# Patient Record
Sex: Male | Born: 2000 | Race: Black or African American | Hispanic: No | Marital: Single | Smoking: Never smoker
Health system: Southern US, Community
[De-identification: ages and names within clinical notes are randomized; demographics above are authoritative.]

## PROBLEM LIST (undated history)

## (undated) DIAGNOSIS — R519 Headache, unspecified: Secondary | ICD-10-CM

## (undated) DIAGNOSIS — J45909 Unspecified asthma, uncomplicated: Secondary | ICD-10-CM

## (undated) DIAGNOSIS — K5792 Diverticulitis of intestine, part unspecified, without perforation or abscess without bleeding: Secondary | ICD-10-CM

## (undated) HISTORY — PX: WISDOM TOOTH EXTRACTION: SHX21

## (undated) HISTORY — DX: Diverticulitis of intestine, part unspecified, without perforation or abscess without bleeding: K57.92

## (undated) HISTORY — DX: Unspecified asthma, uncomplicated: J45.909

## (undated) HISTORY — DX: Headache, unspecified: R51.9

---
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
## 2019-01-01 DIAGNOSIS — Z0000 Encounter for general adult medical examination without abnormal findings: Secondary | ICD-10-CM | POA: Diagnosis not present

## 2019-01-01 DIAGNOSIS — Z111 Encounter for screening for respiratory tuberculosis: Secondary | ICD-10-CM | POA: Diagnosis not present

## 2019-01-01 DIAGNOSIS — Z23 Encounter for immunization: Secondary | ICD-10-CM | POA: Diagnosis not present

## 2019-04-29 DIAGNOSIS — R1030 Lower abdominal pain, unspecified: Secondary | ICD-10-CM | POA: Diagnosis not present

## 2019-09-26 DIAGNOSIS — Z20828 Contact with and (suspected) exposure to other viral communicable diseases: Secondary | ICD-10-CM | POA: Diagnosis not present

## 2020-01-08 ENCOUNTER — Ambulatory Visit: Payer: Self-pay | Attending: Family

## 2020-01-08 DIAGNOSIS — Z23 Encounter for immunization: Secondary | ICD-10-CM

## 2020-01-08 NOTE — Progress Notes (Signed)
   Covid-19 Vaccination Clinic  Name:  Gavin Taylor    MRN: [id number] DOB: [birth year]  01/08/2020  [name] was observed post Covid-19 immunization for 15 minutes without incident. He was provided with Vaccine Information Sheet and instruction to access the V-Safe system.   [name] was instructed to call 911 with any severe reactions post vaccine: Marland Kitchen Difficulty breathing  . Swelling of face and throat  . A fast heartbeat  . A bad rash all over body  . Dizziness and weakness   Immunizations Administered    Name Date Dose VIS Date Route   Moderna COVID-19 Vaccine 01/08/2020 11:47 AM 0.5 mL 09/23/2019 Intramuscular   Manufacturer: Moderna   Lot: [id number]   NDC: 80881-103-15

## 2020-01-20 DIAGNOSIS — N451 Epididymitis: Secondary | ICD-10-CM | POA: Diagnosis not present

## 2020-02-01 DIAGNOSIS — Z03818 Encounter for observation for suspected exposure to other biological agents ruled out: Secondary | ICD-10-CM | POA: Diagnosis not present

## 2020-02-01 DIAGNOSIS — Z20828 Contact with and (suspected) exposure to other viral communicable diseases: Secondary | ICD-10-CM | POA: Diagnosis not present

## 2020-02-10 ENCOUNTER — Ambulatory Visit: Payer: Self-pay | Attending: Family

## 2020-02-10 DIAGNOSIS — Z23 Encounter for immunization: Secondary | ICD-10-CM

## 2020-02-10 NOTE — Progress Notes (Signed)
   Covid-19 Vaccination Clinic  Name:  Gavin Taylor    MRN: [id number] DOB: 02/08/2001  02/10/2020  [name] was observed post Covid-19 immunization for 15 minutes without incident. He was provided with Vaccine Information Sheet and instruction to access the V-Safe system.   [name] was instructed to call 911 with any severe reactions post vaccine: Marland Kitchen Difficulty breathing  . Swelling of face and throat  . A fast heartbeat  . A bad rash all over body  . Dizziness and weakness   Immunizations Administered    Name Date Dose VIS Date Route   Moderna COVID-19 Vaccine 02/10/2020 11:04 AM 0.5 mL 09/2019 Intramuscular   Manufacturer: Moderna   Lot: [id number]   NDC: 67893-810-17

## 2020-04-11 DIAGNOSIS — Z20822 Contact with and (suspected) exposure to covid-19: Secondary | ICD-10-CM | POA: Diagnosis not present

## 2020-05-07 DIAGNOSIS — Z20822 Contact with and (suspected) exposure to covid-19: Secondary | ICD-10-CM | POA: Diagnosis not present

## 2020-05-07 DIAGNOSIS — Z03818 Encounter for observation for suspected exposure to other biological agents ruled out: Secondary | ICD-10-CM | POA: Diagnosis not present

## 2020-05-21 DIAGNOSIS — Z20822 Contact with and (suspected) exposure to covid-19: Secondary | ICD-10-CM | POA: Diagnosis not present

## 2020-09-28 DIAGNOSIS — Z03818 Encounter for observation for suspected exposure to other biological agents ruled out: Secondary | ICD-10-CM | POA: Diagnosis not present

## 2020-09-28 DIAGNOSIS — Z20822 Contact with and (suspected) exposure to covid-19: Secondary | ICD-10-CM | POA: Diagnosis not present

## 2020-10-08 ENCOUNTER — Ambulatory Visit (INDEPENDENT_AMBULATORY_CARE_PROVIDER_SITE_OTHER): Payer: BC Managed Care – PPO | Admitting: Family Medicine

## 2020-10-08 ENCOUNTER — Encounter: Payer: Self-pay | Admitting: Family Medicine

## 2020-10-08 ENCOUNTER — Other Ambulatory Visit: Payer: Self-pay

## 2020-10-08 VITALS — BP 120/78 | HR 78 | Temp 97.9°F | Ht 69.0 in | Wt 144.0 lb

## 2020-10-08 DIAGNOSIS — R519 Headache, unspecified: Secondary | ICD-10-CM | POA: Diagnosis not present

## 2020-10-08 DIAGNOSIS — K5792 Diverticulitis of intestine, part unspecified, without perforation or abscess without bleeding: Secondary | ICD-10-CM | POA: Diagnosis not present

## 2020-10-08 DIAGNOSIS — Z0000 Encounter for general adult medical examination without abnormal findings: Secondary | ICD-10-CM

## 2020-10-08 DIAGNOSIS — Z09 Encounter for follow-up examination after completed treatment for conditions other than malignant neoplasm: Secondary | ICD-10-CM

## 2020-10-08 DIAGNOSIS — J45909 Unspecified asthma, uncomplicated: Secondary | ICD-10-CM | POA: Diagnosis not present

## 2020-10-08 MED ORDER — ALBUTEROL SULFATE HFA 108 (90 BASE) MCG/ACT IN AERS: 2.0000 | INHALATION_SPRAY | Freq: Four times a day (QID) | RESPIRATORY_TRACT | 11 refills | Status: AC | PRN

## 2020-10-08 MED ORDER — IBUPROFEN 800 MG PO TABS: 800.0000 mg | ORAL_TABLET | Freq: Three times a day (TID) | ORAL | 3 refills | Status: DC | PRN

## 2020-10-08 NOTE — Progress Notes (Signed)
[facility]   New Patient--Establish Care  Subjective:  Patient ID: Gavin Taylor, male    DOB: 02-05-2001  Age: 19 y.o. MRN: [id number]  CC:  Chief Complaint  Patient presents with  . Abdominal Pain  . Establish Care   HPI Gavin Taylor is a 19 year old male who presents to Establish Care today.    There are no problems to display for this patient.   Current Status: This will be Mr. Faucett initial office visit with me. He was previously seeing a Physician in [place], [place], Dr. Tyrell Antonio for his PCP needs. He is a current Consulting civil engineer at [employer]. Since his last office visit, he states that he has had increased headaches intermittently, X 1 week now. He takes ASA and Acetaminophen for minimal relief. He denies fevers, chills, fatigue, recent infections, weight loss, and night sweats. He has not had any headaches, visual changes, dizziness, and falls. No chest pain, heart palpitations, cough and shortness of breath reported. No reports of GI problems such as nausea, vomiting, diarrhea, and constipation. He has no reports of blood in stools, dysuria and hematuria. No depression or anxiety reported today. He is taking all medications as prescribed. He denies pain today.   Past Medical History:  Diagnosis Date  . Asthma   . Diverticulitis   . Frequent headaches     Past Surgical History:  Procedure Laterality Date  . WISDOM TOOTH EXTRACTION     2019    Family History  Problem Relation Age of Onset  . Hypertension Father   . Asthma Sister     Social History   Socioeconomic History  . Marital status: Single    Spouse name: Not on file  . Number of children: Not on file  . Years of education: Not on file  . Highest education level: Not on file  Occupational History  . Not on file  Tobacco Use  . Smoking status: Never Smoker  . Smokeless tobacco: Never Used  Substance and Sexual Activity  . Alcohol use: Not Currently   . Drug use: Not Currently  . Sexual activity: Not on file  Other Topics Concern  . Not on file  Social History Narrative  . Not on file   Social Determinants of Health   Financial Resource Strain: Not on file  Food Insecurity: Not on file  Transportation Needs: Not on file  Physical Activity: Not on file  Stress: Not on file  Social Connections: Not on file  Intimate Partner Violence: Not on file    Outpatient Medications Prior to Visit  Medication Sig Dispense Refill  . diclofenac (VOLTAREN) 75 MG EC tablet Take 75 mg by mouth 2 (two) times daily.     No facility-administered medications prior to visit.    Not on File  ROS Review of Systems  Constitutional: Negative.   HENT: Negative.   Eyes: Negative.   Respiratory: Negative.   Cardiovascular: Negative.   Gastrointestinal: Positive for abdominal pain (occasional ).  Endocrine: Negative.   Genitourinary: Negative.   Musculoskeletal: Negative.   Skin: Negative.   Allergic/Immunologic: Negative.   Neurological: Negative.   Hematological: Negative.   Psychiatric/Behavioral: Negative.       Objective:    Physical Exam Vitals and nursing note reviewed.  Constitutional:      Appearance: Normal appearance. He is well-developed.  HENT:     Head: Normocephalic and atraumatic.     Nose: Nose normal.  Mouth/Throat:     Mouth: Mucous membranes are moist.     Pharynx: Oropharynx is clear.  Cardiovascular:     Rate and Rhythm: Normal rate.  Pulmonary:     Effort: Pulmonary effort is normal.     Breath sounds: Normal breath sounds.  Abdominal:     General: Bowel sounds are normal.     Palpations: Abdomen is soft.  Musculoskeletal:        General: Normal range of motion.     Cervical back: Normal range of motion and neck supple.  Skin:    General: Skin is warm and dry.  Neurological:     General: No focal deficit present.     Mental Status: He is alert and oriented to person, place, and time.   Psychiatric:        Mood and Affect: Mood normal.        Behavior: Behavior normal.        Thought Content: Thought content normal.        Judgment: Judgment normal.     BP 120/78   Pulse 78   Temp 97.9 F (36.6 C) (Temporal)   Ht 5\' 9"  (1.753 m)   Wt 144 lb (65.3 kg)   SpO2 98%   BMI 21.27 kg/m  Wt Readings from Last 3 Encounters:  10/08/20 144 lb (65.3 kg)     Health Maintenance Due  Topic Date Due  . Hepatitis C Screening  Never done  . HIV Screening  Never done  . TETANUS/TDAP  Never done  . INFLUENZA VACCINE  Never done    There are no preventive care reminders to display for this patient.  No results found for: TSH Lab Results  Component Value Date   WBC 4.0 10/08/2020   HGB 14.3 10/08/2020   HCT 42.4 10/08/2020   MCV 91 10/08/2020   PLT 253 10/08/2020   Lab Results  Component Value Date   NA 141 10/08/2020   K 4.5 10/08/2020   CO2 28 10/08/2020   GLUCOSE 75 10/08/2020   BUN 15 10/08/2020   CREATININE 1.03 10/08/2020   BILITOT <0.2 10/08/2020   ALKPHOS 53 10/08/2020   AST 18 10/08/2020   ALT 13 10/08/2020   PROT 7.8 10/08/2020   ALBUMIN 4.3 10/08/2020   CALCIUM 9.8 10/08/2020   No results found for: CHOL No results found for: HDL No results found for: LDLCALC No results found for: TRIG No results found for: CHOLHDL No results found for: 10/10/2020    Assessment & Plan:   1. Nonintractable headache, unspecified chronicity pattern, unspecified headache type Stable today. He will drink at least 64 oz of fluids daily, he will take Motrin as needed.  - ibuprofen (ADVIL) 800 MG tablet; Take 1 tablet (800 mg total) by mouth every 8 (eight) hours as needed.  Dispense: 30 tablet; Refill: 3  2. Mild asthma without complication, unspecified whether persistent Stable. No signs or symptoms of respiratory distress noted or reported.  - albuterol (VENTOLIN HFA) 108 (90 Base) MCG/ACT inhaler; Inhale 2 puffs into the lungs every 6 (six) hours as needed for  wheezing or shortness of breath.  Dispense: 8 g; Refill: 11  3. Diverticulitis Stable today. He will report to clinic if symptoms increase.  Recommendations for treatment of Diverticulosis:   Eating a high-fiber diet. This may include eating more fruits, vegetables, and grains.  Taking a fiber supplement.  Taking a live bacteria supplement (probiotic).  Taking medicine to relax your colon.  Taking antibiotic medicines.  4. Healthcare maintenance - CBC with Differential - Comprehensive metabolic panel  5. Follow up He will follow up in 6 months.   Meds ordered this encounter  Medications  . ibuprofen (ADVIL) 800 MG tablet    Sig: Take 1 tablet (800 mg total) by mouth every 8 (eight) hours as needed.    Dispense:  30 tablet    Refill:  3  . albuterol (VENTOLIN HFA) 108 (90 Base) MCG/ACT inhaler    Sig: Inhale 2 puffs into the lungs every 6 (six) hours as needed for wheezing or shortness of breath.    Dispense:  8 g    Refill:  11    Orders Placed This Encounter  Procedures  . CBC with Differential  . Comprehensive metabolic panel    Referral Orders  No referral(s) requested today    Raliegh Ip, MSN, ANE, FNP-BC [facility] [address] [place], [place] [address] [phone] fax   Problem List Items Addressed This Visit   None   Visit Diagnoses    Nonintractable headache, unspecified chronicity pattern, unspecified headache type    -  Primary   Relevant Medications   diclofenac (VOLTAREN) 75 MG EC tablet   ibuprofen (ADVIL) 800 MG tablet   Mild asthma without complication, unspecified whether persistent       Relevant Medications   albuterol (VENTOLIN HFA) 108 (90 Base) MCG/ACT inhaler   Diverticulitis       Healthcare maintenance       Relevant Orders   CBC with Differential (Completed)   Comprehensive metabolic panel (Completed)   Follow up           Meds ordered this encounter  Medications  . ibuprofen (ADVIL) 800 MG tablet    Sig: Take 1 tablet (800 mg total) by mouth every 8 (eight) hours as needed.    Dispense:  30 tablet    Refill:  3  . albuterol (VENTOLIN HFA) 108 (90 Base) MCG/ACT inhaler    Sig: Inhale 2 puffs into the lungs every 6 (six) hours as needed for wheezing or shortness of breath.    Dispense:  8 g    Refill:  11    Follow-up: Return in about 6 months (around 04/08/2021).    Kallie Locks, FNP

## 2020-10-08 NOTE — Patient Instructions (Signed)
Diverticulitis  Diverticulitis is when small pockets in your large intestine (colon) get infected or swollen. This causes stomach pain and watery poop (diarrhea). These pouches are called diverticula. They form in people who have a condition called diverticulosis. Follow these instructions at home: Medicines  Take over-the-counter and prescription medicines only as told by your doctor. These include: ? Antibiotics. ? Pain medicines. ? Fiber pills. ? Probiotics. ? Stool softeners.  Do not drive or use heavy machinery while taking prescription pain medicine.  If you were prescribed an antibiotic, take it as told. Do not stop taking it even if you feel better. General instructions   Follow a diet as told by your doctor.  When you feel better, your doctor may tell you to change your diet. You may need to eat a lot of fiber. Fiber makes it easier to poop (have bowel movements). Healthy foods with fiber include: ? Berries. ? Beans. ? Lentils. ? Green vegetables.  Exercise 3 or more times a week. Aim for 30 minutes each time. Exercise enough to sweat and make your heart beat faster.  Keep all follow-up visits as told. This is important. You may need to have an exam of the large intestine. This is called a colonoscopy. Contact a doctor if:  Your pain does not get better.  You have a hard time eating or drinking.  You are not pooping like normal. Get help right away if:  Your pain gets worse.  Your problems do not get better.  Your problems get worse very fast.  You have a fever.  You throw up (vomit) more than one time.  You have poop that is: ? Bloody. ? Black. ? Tarry. Summary  Diverticulitis is when small pockets in your large intestine (colon) get infected or swollen.  Take medicines only as told by your doctor.  Follow a diet as told by your doctor. This information is not intended to replace advice given to you by your health care provider. Make sure you  discuss any questions you have with your health care provider. Document Revised: 09/21/2017 Document Reviewed: 10/26/2016 Elsevier Patient Education  2020 Elsevier Inc.   General Headache Without Cause A headache is pain or discomfort that is felt around the head or neck area. There are many causes and types of headaches. In some cases, the cause may not be found. Follow these instructions at home: Watch your condition for any changes. Let your doctor know about them. Take these steps to help with your condition: Managing pain      Take over-the-counter and prescription medicines only as told by your doctor.  Lie down in a dark, quiet room when you have a headache.  If told, put ice on your head and neck area: ? Put ice in a plastic bag. ? Place a towel between your skin and the bag. ? Leave the ice on for 20 minutes, 2-3 times per day.  If told, put heat on the affected area. Use the heat source that your doctor recommends, such as a moist heat pack or a heating pad. ? Place a towel between your skin and the heat source. ? Leave the heat on for 20-30 minutes. ? Remove the heat if your skin turns bright red. This is very important if you are unable to feel pain, heat, or cold. You may have a greater risk of getting burned.  Keep lights dim if bright lights bother you or make your headaches worse. Eating and drinking  Eat  meals on a regular schedule.  If you drink alcohol: ? Limit how much you use to:  0-1 drink a day for women.  0-2 drinks a day for men. ? Be aware of how much alcohol is in your drink. In the [place] one drink equals one 12 oz bottle of beer (355 mL), one 5 oz glass of wine (148 mL), or one 1 oz glass of hard liquor (44 mL).  Stop drinking caffeine, or reduce how much caffeine you drink. General instructions   Keep a journal to find out if certain things bring on headaches. For example, write down: ? What you eat and drink. ? How much sleep you  get. ? Any change to your diet or medicines.  Get a massage or try other ways to relax.  Limit stress.  Sit up straight. Do not tighten (tense) your muscles.  Do not use any products that contain nicotine or tobacco. This includes cigarettes, e-cigarettes, and chewing tobacco. If you need help quitting, ask your doctor.  Exercise regularly as told by your doctor.  Get enough sleep. This often means 7-9 hours of sleep each night.  Keep all follow-up visits as told by your doctor. This is important. Contact a doctor if:  Your symptoms are not helped by medicine.  You have a headache that feels different than the other headaches.  You feel sick to your stomach (nauseous) or you throw up (vomit).  You have a fever. Get help right away if:  Your headache gets very bad quickly.  Your headache gets worse after a lot of physical activity.  You keep throwing up.  You have a stiff neck.  You have trouble seeing.  You have trouble speaking.  You have pain in the eye or ear.  Your muscles are weak or you lose muscle control.  You lose your balance or have trouble walking.  You feel like you will pass out (faint) or you pass out.  You are mixed up (confused).  You have a seizure. Summary  A headache is pain or discomfort that is felt around the head or neck area.  There are many causes and types of headaches. In some cases, the cause may not be found.  Keep a journal to help find out what causes your headaches. Watch your condition for any changes. Let your doctor know about them.  Contact a doctor if you have a headache that is different from usual, or if your headache is not helped by medicine.  Get help right away if your headache gets very bad, you throw up, you have trouble seeing, you lose your balance, or you have a seizure. This information is not intended to replace advice given to you by your health care provider. Make sure you discuss any questions you have  with your health care provider. Document Revised: 04/29/2018 Document Reviewed: 04/29/2018 Elsevier Patient Education  2020 Elsevier Inc. Ibuprofen tablets and capsules What is this medicine? IBUPROFEN (eye BYOO proe fen) is a non-steroidal anti-inflammatory drug (NSAID). It is used for dental pain, fever, headaches or migraines, osteoarthritis, rheumatoid arthritis, or painful monthly periods. It can also relieve minor aches and pains caused by a cold, flu, or sore throat. This medicine may be used for other purposes; ask your health care provider or pharmacist if you have questions. COMMON BRAND NAME(S): Advil, Advil Junior Strength, Advil Migraine, Genpril, Ibren, IBU, Ibupak, Midol, Midol Cramps and Body Aches, Motrin, Motrin IB, Motrin Junior Strength, Motrin Migraine Pain, Samson-8, Toxicology  Saliva Collection What should I tell my health care provider before I take this medicine? They need to know if you have any of these conditions:  cigarette smoker  coronary artery bypass graft (CABG) surgery within the past 2 weeks  drink more than 3 alcohol-containing drinks a day  heart disease  high blood pressure  history of stomach bleeding  kidney disease  liver disease  lung or breathing disease, like asthma  an unusual or allergic reaction to ibuprofen, aspirin, other NSAIDs, other medicines, foods, dyes, or preservatives  pregnant or trying to get pregnant  breast-feeding How should I use this medicine? Take this medicine by mouth with a glass of water. Follow the directions on the prescription label. Take this medicine with food if your stomach gets upset. Try to not lie down for at least 10 minutes after you take the medicine. Take your medicine at regular intervals. Do not take your medicine more often than directed. A special MedGuide will be given to you by the pharmacist with each prescription and refill. Be sure to read this information carefully each time. Talk to  your pediatrician regarding the use of this medicine in children. Special care may be needed. Overdosage: If you think you have taken too much of this medicine contact a poison control center or emergency room at once. NOTE: This medicine is only for you. Do not share this medicine with others. What if I miss a dose? If you miss a dose, take it as soon as you can. If it is almost time for your next dose, take only that dose. Do not take double or extra doses. What may interact with this medicine? Do not take this medicine with any of the following medications:  cidofovir  ketorolac  methotrexate  pemetrexed This medicine may also interact with the following medications:  alcohol  aspirin  diuretics  lithium  other drugs for inflammation like prednisone  warfarin This list may not describe all possible interactions. Give your health care provider a list of all the medicines, herbs, non-prescription drugs, or dietary supplements you use. Also tell them if you smoke, drink alcohol, or use illegal drugs. Some items may interact with your medicine. What should I watch for while using this medicine? Tell your doctor or healthcare provider if your symptoms do not start to get better or if they get worse. This medicine may cause serious skin reactions. They can happen weeks to months after starting the medicine. Contact your healthcare provider right away if you notice fevers or flu-like symptoms with a rash. The rash may be red or purple and then turn into blisters or peeling of the skin. Or, you might notice a red rash with swelling of the face, lips or lymph nodes in your neck or under your arms. This medicine does not prevent heart attack or stroke. In fact, this medicine may increase the chance of a heart attack or stroke. The chance may increase with longer use of this medicine and in people who have heart disease. If you take aspirin to prevent heart attack or stroke, talk with your  doctor or healthcare provider. Do not take other medicines that contain aspirin, ibuprofen, or naproxen with this medicine. Side effects such as stomach upset, nausea, or ulcers may be more likely to occur. Many medicines available without a prescription should not be taken with this medicine. This medicine can cause ulcers and bleeding in the stomach and intestines at any time during treatment. Ulcers and bleeding  can happen without warning symptoms and can cause death. To reduce your risk, do not smoke cigarettes or drink alcohol while you are taking this medicine. You may get drowsy or dizzy. Do not drive, use machinery, or do anything that needs mental alertness until you know how this medicine affects you. Do not stand or sit up quickly, especially if you are an older patient. This reduces the risk of dizzy or fainting spells. This medicine can cause you to bleed more easily. Try to avoid damage to your teeth and gums when you brush or floss your teeth. This medicine may be used to treat migraines. If you take migraine medicines for 10 or more days a month, your migraines may get worse. Keep a diary of headache days and medicine use. Contact your healthcare provider if your migraine attacks occur more frequently. What side effects may I notice from receiving this medicine? Side effects that you should report to your doctor or health care professional as soon as possible:  allergic reactions like skin rash, itching or hives, swelling of the face, lips, or tongue  redness, blistering, peeling or loosening of the skin, including inside the mouth  severe stomach pain  signs and symptoms of bleeding such as bloody or black, tarry stools; red or dark-brown urine; spitting up blood or brown material that looks like coffee grounds; red spots on the skin; unusual bruising or bleeding from the eye, gums, or nose  signs and symptoms of a blood clot such as changes in vision; chest pain; severe, sudden  headache; trouble speaking; sudden numbness or weakness of the face, arm, or leg  unexplained weight gain or swelling  unusually weak or tired  yellowing of eyes or skin Side effects that usually do not require medical attention (report to your doctor or health care professional if they continue or are bothersome):  bruising  diarrhea  dizziness, drowsiness  headache  nausea, vomiting This list may not describe all possible side effects. Call your doctor for medical advice about side effects. You may report side effects to FDA at 1-800-FDA-1088. Where should I keep my medicine? Keep out of the reach of children. Store at room temperature between 15 and 30 degrees C (59 and 86 degrees F). Keep container tightly closed. Throw away any unused medicine after the expiration date. NOTE: This sheet is a summary. It may not cover all possible information. If you have questions about this medicine, talk to your doctor, pharmacist, or health care provider.  2020 Elsevier/Gold Standard (2018-12-25 14:11:00)

## 2020-10-09 LAB — CBC WITH DIFFERENTIAL/PLATELET
Basophils Absolute: 0 10*3/uL (ref 0.0–0.2)
Basos: 1 %
EOS (ABSOLUTE): 0.1 10*3/uL (ref 0.0–0.4)
Eos: 2 %
Hematocrit: 42.4 % (ref 37.5–51.0)
Hemoglobin: 14.3 g/dL (ref 13.0–17.7)
Immature Grans (Abs): 0 10*3/uL (ref 0.0–0.1)
Immature Granulocytes: 0 %
Lymphocytes Absolute: 1.8 10*3/uL (ref 0.7–3.1)
Lymphs: 43 %
MCH: 30.6 pg (ref 26.6–33.0)
MCHC: 33.7 g/dL (ref 31.5–35.7)
MCV: 91 fL (ref 79–97)
Monocytes Absolute: 0.4 10*3/uL (ref 0.1–0.9)
Monocytes: 11 %
Neutrophils Absolute: 1.7 10*3/uL (ref 1.4–7.0)
Neutrophils: 43 %
Platelets: 253 10*3/uL (ref 150–450)
RBC: 4.68 x10E6/uL (ref 4.14–5.80)
RDW: 12.8 % (ref 11.6–15.4)
WBC: 4 10*3/uL (ref 3.4–10.8)

## 2020-10-09 LAB — COMPREHENSIVE METABOLIC PANEL
ALT: 13 IU/L (ref 0–44)
AST: 18 IU/L (ref 0–40)
Albumin/Globulin Ratio: 1.2 (ref 1.2–2.2)
Albumin: 4.3 g/dL (ref 4.1–5.2)
Alkaline Phosphatase: 53 IU/L (ref 51–125)
BUN/Creatinine Ratio: 15 (ref 9–20)
BUN: 15 mg/dL (ref 6–20)
Bilirubin Total: <0.2 mg/dL (ref 0.0–1.2)
CO2: 28 mmol/L (ref 20–29)
Calcium: 9.8 mg/dL (ref 8.7–10.2)
Chloride: 101 mmol/L (ref 96–106)
Creatinine, Ser: 1.03 mg/dL (ref 0.76–1.27)
GFR calc Af Amer: 120 mL/min/{1.73_m2} (ref >59)
GFR calc non Af Amer: 104 mL/min/{1.73_m2} (ref >59)
Globulin, Total: 3.5 g/dL (ref 1.5–4.5)
Glucose: 75 mg/dL (ref 65–99)
Potassium: 4.5 mmol/L (ref 3.5–5.2)
Sodium: 141 mmol/L (ref 134–144)
Total Protein: 7.8 g/dL (ref 6.0–8.5)

## 2020-10-10 ENCOUNTER — Encounter: Payer: Self-pay | Admitting: Family Medicine

## 2020-10-21 ENCOUNTER — Other Ambulatory Visit: Payer: BC Managed Care – PPO

## 2020-10-21 DIAGNOSIS — Z20822 Contact with and (suspected) exposure to covid-19: Secondary | ICD-10-CM | POA: Diagnosis not present

## 2020-10-23 LAB — SARS-COV-2, NAA 2 DAY TAT

## 2020-10-23 LAB — NOVEL CORONAVIRUS, NAA: SARS-CoV-2, NAA: NOT DETECTED

## 2020-10-26 ENCOUNTER — Telehealth (INDEPENDENT_AMBULATORY_CARE_PROVIDER_SITE_OTHER): Payer: BC Managed Care – PPO | Admitting: Family Medicine

## 2020-10-26 ENCOUNTER — Telehealth: Payer: BC Managed Care – PPO | Admitting: Family Medicine

## 2020-10-26 ENCOUNTER — Other Ambulatory Visit: Payer: Self-pay

## 2020-10-26 DIAGNOSIS — K5792 Diverticulitis of intestine, part unspecified, without perforation or abscess without bleeding: Secondary | ICD-10-CM

## 2020-10-26 DIAGNOSIS — Z09 Encounter for follow-up examination after completed treatment for conditions other than malignant neoplasm: Secondary | ICD-10-CM

## 2020-10-26 DIAGNOSIS — R519 Headache, unspecified: Secondary | ICD-10-CM

## 2020-10-26 DIAGNOSIS — J45909 Unspecified asthma, uncomplicated: Secondary | ICD-10-CM

## 2020-10-27 ENCOUNTER — Ambulatory Visit: Payer: BC Managed Care – PPO | Admitting: Family Medicine

## 2020-11-07 NOTE — Progress Notes (Signed)
Patient cancelled appointment for today.

## 2021-01-05 ENCOUNTER — Other Ambulatory Visit: Payer: Self-pay

## 2021-01-05 ENCOUNTER — Ambulatory Visit (INDEPENDENT_AMBULATORY_CARE_PROVIDER_SITE_OTHER): Payer: BC Managed Care – PPO | Admitting: Family Medicine

## 2021-01-05 ENCOUNTER — Encounter: Payer: Self-pay | Admitting: Family Medicine

## 2021-01-05 VITALS — BP 117/55 | HR 71 | Ht 69.0 in | Wt 142.0 lb

## 2021-01-05 DIAGNOSIS — Z09 Encounter for follow-up examination after completed treatment for conditions other than malignant neoplasm: Secondary | ICD-10-CM

## 2021-01-05 DIAGNOSIS — Z8719 Personal history of other diseases of the digestive system: Secondary | ICD-10-CM | POA: Diagnosis not present

## 2021-01-05 DIAGNOSIS — R519 Headache, unspecified: Secondary | ICD-10-CM

## 2021-01-05 DIAGNOSIS — K5792 Diverticulitis of intestine, part unspecified, without perforation or abscess without bleeding: Secondary | ICD-10-CM

## 2021-01-05 DIAGNOSIS — R1030 Lower abdominal pain, unspecified: Secondary | ICD-10-CM | POA: Diagnosis not present

## 2021-01-05 DIAGNOSIS — J45909 Unspecified asthma, uncomplicated: Secondary | ICD-10-CM

## 2021-01-05 MED ORDER — DOXYCYCLINE HYCLATE 100 MG PO TABS: 100.0000 mg | ORAL_TABLET | Freq: Two times a day (BID) | ORAL | 0 refills | Status: DC

## 2021-01-05 NOTE — Patient Instructions (Signed)
Doxycycline delayed-release capsules What is this medicine? DOXYCYCLINE (dox i SYE kleen) is a tetracycline antibiotic. It is used to treat certain kinds of bacterial infections, Lyme disease, and malaria. It will not work for colds, flu, or other viral infections. This medicine may be used for other purposes; ask your health care provider or pharmacist if you have questions. COMMON BRAND NAME(S): Oracea What should I tell my health care provider before I take this medicine? They need to know if you have any of these conditions: bowel disease like colitis liver disease long exposure to sunlight like working outdoors an unusual or allergic reaction to doxycycline, tetracycline antibiotics, other medicines, foods, dyes, or preservatives pregnant or trying to get pregnant breast-feeding How should I use this medicine? Take this medicine by mouth with a full glass of water. Follow the directions on the prescription label. Do not crush or chew. The capsules may be opened and the pellets sprinkled on applesauce. Swallow the pellets whole without chewing. Follow with an 8 ounce glass of water to help you swallow all the pellets. Do not prepare a dose and store for later use. The applesauce mixture should be taken immediately after you prepare it. It is best to take this medicine without other food, but if it upsets your stomach take it with food. Take your medicine at regular intervals. Do not take your medicine more often than directed. Take all of your medicine as directed even if you think your are better. Do not skip doses or stop your medicine early. Talk to your pediatrician regarding the use of this medicine in children. While this drug may be prescribed for selected conditions, precautions do apply. Overdosage: If you think you have taken too much of this medicine contact a poison control center or emergency room at once. NOTE: This medicine is only for you. Do not share this medicine with  others. What if I miss a dose? If you miss a dose, take it as soon as you can. If it is almost time for your next dose, take only that dose. Do not take double or extra doses. What may interact with this medicine? antacids barbiturates birth control pills bismuth subsalicylate carbamazepine methoxyflurane other antibiotics phenytoin vitamins that contain iron warfarin This list may not describe all possible interactions. Give your health care provider a list of all the medicines, herbs, non-prescription drugs, or dietary supplements you use. Also tell them if you smoke, drink alcohol, or use illegal drugs. Some items may interact with your medicine. What should I watch for while using this medicine? Tell your doctor or health care professional if your symptoms do not improve. Do not treat diarrhea with over the counter products. Contact your doctor if you have diarrhea that lasts more than 2 days or if it is severe and watery. Do not take this medicine just before going to bed. It may not dissolve properly when you lay down and can cause pain in your throat. Drink plenty of fluids while taking this medicine to also help reduce irritation in your throat. This medicine can make you more sensitive to the sun. Keep out of the sun. If you cannot avoid being in the sun, wear protective clothing and use sunscreen. Do not use sun lamps or tanning beds/booths. If you are being treated for a sexually transmitted infection, avoid sexual contact until you have finished your treatment. Your sexual partner may also need treatment. Avoid antacids, aluminum, calcium, magnesium, and iron products for 4 hours before and  2 hours after taking a dose of this medicine. Birth control pills may not work properly while you are taking this medicine. Talk to your doctor about using an extra method of birth control. If you are using this medicine to prevent malaria, you should still protect yourself from contact with  mosquitos. Stay in screened-in areas, use mosquito nets, keep your body covered, and use an insect repellent. What side effects may I notice from receiving this medicine? Side effects that you should report to your doctor or health care professional as soon as possible: allergic reactions like skin rash, itching or hives, swelling of the face, lips, or tongue difficulty breathing fever itching in the rectal or genital area pain on swallowing rash, fever, and swollen lymph nodes redness, blistering, peeling or loosening of the skin, including inside the mouth severe stomach pain or cramps unusual bleeding or bruising unusually weak or tired yellowing of the eyes or skin Side effects that usually do not require medical attention (report to your doctor or health care professional if they continue or are bothersome): diarrhea loss of appetite nausea, vomiting This list may not describe all possible side effects. Call your doctor for medical advice about side effects. You may report side effects to FDA at 1-800-FDA-1088. Where should I keep my medicine? Keep out of the reach of children. Store at room temperature, below 25 degrees C (77 degrees F). Protect from light. Keep container tightly closed. Throw away any unused medicine after the expiration date. Taking this medicine after the expiration date can make you seriously ill. NOTE: This sheet is a summary. It may not cover all possible information. If you have questions about this medicine, talk to your doctor, pharmacist, or health care provider.  2021 Elsevier/Gold Standard (2019-01-09 13:23:57) Diverticulitis  Diverticulitis is when small pouches in your colon (large intestine) get infected or swollen. This causes pain in the belly (abdomen) and watery poop (diarrhea). These pouches are called diverticula. The pouches form in people who have a condition called diverticulosis. What are the causes? This condition may be caused by poop  (stool) that gets trapped in the pouches in your colon. The poop lets germs (bacteria) grow in the pouches. This causes the infection. What increases the risk? You are more likely to get this condition if you have small pouches in your colon. The risk is higher if:  You are overweight or very overweight (obese).  You do not exercise enough.  You drink alcohol.  You smoke or use products with tobacco in them.  You eat a diet that has a lot of red meat such as beef, pork, or lamb.  You eat a diet that does not have enough fiber in it.  You are older than 21 years of age. What are the signs or symptoms?  Pain in the belly. Pain is often on the left side, but it may be in other areas.  Fever and feeling cold.  Feeling like you may vomit.  Vomiting.  Having cramps.  Feeling full.  Changes to how often you poop.  Blood in your poop. How is this treated? Most cases are treated at home by:  Taking over-the-counter pain medicines.  Following a clear liquid diet.  Taking antibiotic medicines.  Resting. Very bad cases may need to be treated at a hospital. This may include:  Not eating or drinking.  Taking prescription pain medicine.  Getting antibiotic medicines through an IV tube.  Getting fluid and food through an IV tube.  Having surgery. When you are feeling better, your doctor may tell you to have a test to check your colon (colonoscopy). Follow these instructions at home: Medicines  Take over-the-counter and prescription medicines only as told by your doctor. These include: ? Antibiotics. ? Pain medicines. ? Fiber pills. ? Probiotics. ? Stool softeners.  If you were prescribed an antibiotic medicine, take it as told by your doctor. Do not stop taking the antibiotic even if you start to feel better.  Ask your doctor if the medicine prescribed to you requires you to avoid driving or using machinery. Eating and drinking  Follow a diet as told by your  doctor.  When you feel better, your doctor may tell you to change your diet. You may need to eat a lot of fiber. Fiber makes it easier to poop (have a bowel movement). Foods with fiber include: ? Berries. ? Beans. ? Lentils. ? Green vegetables.  Avoid eating red meat.   General instructions  Do not use any products that contain nicotine or tobacco, such as cigarettes, e-cigarettes, and chewing tobacco. If you need help quitting, ask your doctor.  Exercise 3 or more times a week. Try to get 30 minutes each time. Exercise enough to sweat and make your heart beat faster.  Keep all follow-up visits as told by your doctor. This is important. Contact a doctor if:  Your pain does not get better.  You are not pooping like normal. Get help right away if:  Your pain gets worse.  Your symptoms do not get better.  Your symptoms get worse very fast.  You have a fever.  You vomit more than one time.  You have poop that is: ? Bloody. ? Black. ? Tarry. Summary  This condition happens when small pouches in your colon get infected or swollen.  Take medicines only as told by your doctor.  Follow a diet as told by your doctor.  Keep all follow-up visits as told by your doctor. This is important. This information is not intended to replace advice given to you by your health care provider. Make sure you discuss any questions you have with your health care provider. Document Revised: 07/21/2019 Document Reviewed: 07/21/2019 Elsevier Patient Education  2021 [employer].

## 2021-01-05 NOTE — Progress Notes (Signed)
[facility]   Established Patient Office Visit  Subjective:  Patient ID: Gavin Taylor, male    DOB: 02-07-2001  Age: 20 y.o. MRN: [id number]  CC:  Chief Complaint  Patient presents with  . Abdominal Pain    Ongoing for about a year.  History of taking diclofenac.    HPI Gavin Taylor is a 20 year old male who presents for Follow Up today.   There are no problems to display for this patient.  Current Status: Since his last office visit, he continues to have lower abdominal pain with history of diverticulitis. Denies GI problems such as nausea, vomiting, diarrhea, and constipation. He has no reports of blood in stools, dysuria and hematuria. He denies fevers, chills, fatigue, recent infections, weight loss, and night sweats. He has not had any headaches, visual changes, dizziness, and falls. No chest pain, heart palpitations, cough and shortness of breath reported.  No depression or anxiety reported today. He is taking all medications as prescribed.   Past Medical History:  Diagnosis Date  . Asthma   . Diverticulitis   . Frequent headaches     Past Surgical History:  Procedure Laterality Date  . WISDOM TOOTH EXTRACTION     2019    Family History  Problem Relation Age of Onset  . Hypertension Father   . Asthma Sister     Social History   Socioeconomic History  . Marital status: Single    Spouse name: Not on file  . Number of children: Not on file  . Years of education: Not on file  . Highest education level: Not on file  Occupational History  . Not on file  Tobacco Use  . Smoking status: Never Smoker  . Smokeless tobacco: Never Used  Substance and Sexual Activity  . Alcohol use: Not Currently  . Drug use: Not Currently  . Sexual activity: Not on file  Other Topics Concern  . Not on file  Social History Narrative  . Not on file   Social Determinants of Health   Financial Resource Strain: Not on file  Food  Insecurity: Not on file  Transportation Needs: Not on file  Physical Activity: Not on file  Stress: Not on file  Social Connections: Not on file  Intimate Partner Violence: Not on file    Outpatient Medications Prior to Visit  Medication Sig Dispense Refill  . albuterol (VENTOLIN HFA) 108 (90 Base) MCG/ACT inhaler Inhale 2 puffs into the lungs every 6 (six) hours as needed for wheezing or shortness of breath. 8 g 11  . diclofenac (VOLTAREN) 75 MG EC tablet Take 75 mg by mouth 2 (two) times daily.    Marland Kitchen ibuprofen (ADVIL) 800 MG tablet Take 1 tablet (800 mg total) by mouth every 8 (eight) hours as needed. 30 tablet 3   No facility-administered medications prior to visit.    Allergies  Allergen Reactions  . Omnicef [Cefdinir]     ROS Review of Systems  Constitutional: Negative.   HENT: Negative.   Eyes: Negative.   Respiratory: Negative.   Cardiovascular: Negative.   Gastrointestinal: Positive for abdominal pain (generalized).  Endocrine: Negative.   Genitourinary: Negative.   Musculoskeletal: Negative.   Skin: Negative.   Allergic/Immunologic: Negative.   Neurological: Positive for headaches (occasional ).  Hematological: Negative.   Psychiatric/Behavioral: Negative.    Objective:    Physical Exam Vitals and nursing note reviewed.  Constitutional:      Appearance: Normal appearance.  He is well-developed.  HENT:     Head: Normocephalic and atraumatic.     Mouth/Throat:     Mouth: Mucous membranes are moist.     Pharynx: Oropharynx is clear.  Cardiovascular:     Rate and Rhythm: Normal rate and regular rhythm.  Pulmonary:     Effort: Pulmonary effort is normal.     Breath sounds: Normal breath sounds.  Abdominal:     General: Bowel sounds are normal.     Palpations: Abdomen is soft.  Musculoskeletal:        General: Normal range of motion.     Cervical back: Normal range of motion and neck supple.  Skin:    General: Skin is warm and dry.  Neurological:      General: No focal deficit present.     Mental Status: He is alert and oriented to person, place, and time.  Psychiatric:        Mood and Affect: Mood normal.        Behavior: Behavior normal.        Thought Content: Thought content normal.        Judgment: Judgment normal.    BP (!) 117/55   Pulse 71   Ht 5\' 9"  (1.753 m)   Wt 142 lb (64.4 kg)   SpO2 100%   BMI 20.97 kg/m  Wt Readings from Last 3 Encounters:  01/05/21 142 lb (64.4 kg)  10/08/20 144 lb (65.3 kg)     Health Maintenance Due  Topic Date Due  . Hepatitis C Screening  Never done  . HPV VACCINES (1 - Male 2-dose series) Never done  . HIV Screening  Never done  . TETANUS/TDAP  Never done  . INFLUENZA VACCINE  Never done  . COVID-19 Vaccine (3 - Booster for Moderna series) 08/11/2020       Topic Date Due  . HPV VACCINES (1 - Male 2-dose series) Never done    No results found for: TSH Lab Results  Component Value Date   WBC 4.0 10/08/2020   HGB 14.3 10/08/2020   HCT 42.4 10/08/2020   MCV 91 10/08/2020   PLT 253 10/08/2020   Lab Results  Component Value Date   NA 141 10/08/2020   K 4.5 10/08/2020   CO2 28 10/08/2020   GLUCOSE 75 10/08/2020   BUN 15 10/08/2020   CREATININE 1.03 10/08/2020   BILITOT <0.2 10/08/2020   ALKPHOS 53 10/08/2020   AST 18 10/08/2020   ALT 13 10/08/2020   PROT 7.8 10/08/2020   ALBUMIN 4.3 10/08/2020   CALCIUM 9.8 10/08/2020   No results found for: CHOL No results found for: HDL No results found for: LDLCALC No results found for: TRIG No results found for: CHOLHDL No results found for: 10/10/2020    Assessment & Plan:   1. Mild asthma without complication, unspecified whether persistent Stable. No signs or symptoms of respiratory distress noted or reported.   2. Nonintractable headache, unspecified chronicity pattern, unspecified headache type Stable.   3. Lower abdominal pain - [id number] Abdomen Complete; Future  4. History of diverticulitis - US Abdomen Complete;  Future - doxycycline (VIBRA-TABS) 100 MG tablet; Take 1 tablet (100 mg total) by mouth 2 (two) times daily.  Dispense: 20 tablet; Refill: 0  5. Diverticulitis  6. Follow up He will follow up 09/2021 for Annual Physical and Labwork.   Meds ordered this encounter  Medications  . doxycycline (VIBRA-TABS) 100 MG tablet    Sig: Take 1  tablet (100 mg total) by mouth 2 (two) times daily.    Dispense:  20 tablet    Refill:  0   Orders Placed This Encounter  Procedures  . US Abdomen Complete    Referral Orders  No referral(s) requested today    Raliegh Ip, MSN, ANE, FNP-BC [facility] [address] [place], [place] [address] [phone] fax    Problem List Items Addressed This Visit   None   Visit Diagnoses    Mild asthma without complication, unspecified whether persistent    -  Primary   Nonintractable headache, unspecified chronicity pattern, unspecified headache type       Lower abdominal pain       Relevant Orders   US Abdomen Complete   History of diverticulitis       Relevant Medications   doxycycline (VIBRA-TABS) 100 MG tablet   Other Relevant Orders   US Abdomen Complete   Diverticulitis       Follow up          Meds ordered this encounter  Medications  . doxycycline (VIBRA-TABS) 100 MG tablet    Sig: Take 1 tablet (100 mg total) by mouth 2 (two) times daily.    Dispense:  20 tablet    Refill:  0    Follow-up: No follow-ups on file.    Kallie Locks, FNP

## 2021-01-12 ENCOUNTER — Other Ambulatory Visit: Payer: Self-pay

## 2021-01-12 ENCOUNTER — Ambulatory Visit (HOSPITAL_COMMUNITY)
Admission: RE | Admit: 2021-01-12 | Discharge: 2021-01-12 | Disposition: A | Discharge status: Home / Self Care | Payer: BC Managed Care – PPO | Source: Ambulatory Visit | Attending: Family Medicine | Admitting: Family Medicine

## 2021-01-12 DIAGNOSIS — R1030 Lower abdominal pain, unspecified: Secondary | ICD-10-CM | POA: Diagnosis not present

## 2021-01-12 DIAGNOSIS — Z8719 Personal history of other diseases of the digestive system: Secondary | ICD-10-CM | POA: Insufficient documentation

## 2021-01-12 DIAGNOSIS — R109 Unspecified abdominal pain: Secondary | ICD-10-CM | POA: Diagnosis not present

## 2021-01-13 ENCOUNTER — Encounter: Payer: Self-pay | Admitting: Family Medicine

## 2021-01-13 ENCOUNTER — Other Ambulatory Visit: Payer: Self-pay | Admitting: Nurse Practitioner

## 2021-01-13 DIAGNOSIS — R1030 Lower abdominal pain, unspecified: Secondary | ICD-10-CM

## 2021-01-13 DIAGNOSIS — Z8719 Personal history of other diseases of the digestive system: Secondary | ICD-10-CM

## 2021-01-13 MED ORDER — ONDANSETRON 8 MG PO TBDP: 8.0000 mg | ORAL_TABLET | Freq: Two times a day (BID) | ORAL | 0 refills | Status: AC

## 2021-01-13 NOTE — Progress Notes (Signed)
   [facility] Patient [facility] [address] Pall [place], [place]  [address] Phone:  [phone]   Fax:  [phone]  Patient continues to have nausea and vomiting with doxycycline. Hx of Diverticulosis   My recommendation is to try ondansetron for the nausea and a GI referral  If symptoms persist ED until he can be seen.

## 2021-01-24 ENCOUNTER — Telehealth: Payer: Self-pay

## 2021-01-24 NOTE — Telephone Encounter (Signed)
Pt hasn't heard from GI doctor and was told to let us know.

## 2021-01-24 NOTE — Telephone Encounter (Signed)
Called and spoke w/ [facility], regarding pt referral pt is process set for and appt. Left voicemail for pt about this. GI also said that the can call the office regarding setting up an referral. Left detail voicemail for this.

## 2021-01-27 ENCOUNTER — Encounter: Payer: Self-pay | Admitting: Physician Assistant

## 2021-01-27 ENCOUNTER — Encounter: Payer: Self-pay | Admitting: Gastroenterology

## 2021-02-15 ENCOUNTER — Ambulatory Visit (INDEPENDENT_AMBULATORY_CARE_PROVIDER_SITE_OTHER): Payer: BC Managed Care – PPO | Admitting: Physician Assistant

## 2021-02-15 ENCOUNTER — Other Ambulatory Visit (INDEPENDENT_AMBULATORY_CARE_PROVIDER_SITE_OTHER): Payer: BC Managed Care – PPO

## 2021-02-15 ENCOUNTER — Encounter: Payer: Self-pay | Admitting: Physician Assistant

## 2021-02-15 VITALS — BP 100/64 | HR 68 | Ht 69.0 in | Wt 140.2 lb

## 2021-02-15 DIAGNOSIS — R102 Pelvic and perineal pain: Secondary | ICD-10-CM

## 2021-02-15 DIAGNOSIS — R1032 Left lower quadrant pain: Secondary | ICD-10-CM

## 2021-02-15 DIAGNOSIS — R1031 Right lower quadrant pain: Secondary | ICD-10-CM | POA: Diagnosis not present

## 2021-02-15 LAB — CBC WITH DIFFERENTIAL/PLATELET
Basophils Absolute: 0 10*3/uL (ref 0.0–0.1)
Basophils Relative: 0.6 % (ref 0.0–3.0)
Eosinophils Absolute: 0.2 10*3/uL (ref 0.0–0.7)
Eosinophils Relative: 3.7 % (ref 0.0–5.0)
HCT: 45.7 % (ref 39.0–52.0)
Hemoglobin: 15.4 g/dL (ref 13.0–17.0)
Lymphocytes Relative: 29 % (ref 12.0–46.0)
Lymphs Abs: 1.5 10*3/uL (ref 0.7–4.0)
MCHC: 33.7 g/dL (ref 30.0–36.0)
MCV: 89.7 fl (ref 78.0–100.0)
Monocytes Absolute: 0.3 10*3/uL (ref 0.1–1.0)
Monocytes Relative: 6.7 % (ref 3.0–12.0)
Neutro Abs: 3.1 10*3/uL (ref 1.4–7.7)
Neutrophils Relative %: 60 % (ref 43.0–77.0)
Platelets: 173 10*3/uL (ref 150.0–400.0)
RBC: 5.1 Mil/uL (ref 4.22–5.81)
RDW: 12.6 % (ref 11.5–14.6)
WBC: 5.1 10*3/uL (ref 4.5–10.5)

## 2021-02-15 LAB — BASIC METABOLIC PANEL
BUN: 17 mg/dL (ref 6–23)
CO2: 30 mEq/L (ref 19–32)
Calcium: 9.5 mg/dL (ref 8.4–10.5)
Chloride: 103 mEq/L (ref 96–112)
Creatinine, Ser: 1.01 mg/dL (ref 0.40–1.50)
GFR: 106.96 mL/min (ref >60.00)
Glucose, Bld: 94 mg/dL (ref 70–99)
Potassium: 3.5 mEq/L (ref 3.5–5.1)
Sodium: 139 mEq/L (ref 135–145)

## 2021-02-15 LAB — SEDIMENTATION RATE: Sed Rate: 3 mm/hr (ref 0–15)

## 2021-02-15 LAB — C-REACTIVE PROTEIN: CRP: <1 mg/dL (ref 0.5–20.0)

## 2021-02-15 NOTE — Patient Instructions (Addendum)
If you are age 21 or older, your body mass index should be between 23-30. Your Body mass index is 20.71 kg/m. If this is out of the aforementioned range listed, please consider follow up with your Primary Care Provider.  If you are age 5 or younger, your body mass index should be between 19-25. Your Body mass index is 20.71 kg/m. If this is out of the aformentioned range listed, please consider follow up with your Primary Care Provider.   You have been scheduled for a CT scan of the abdomen and pelvis at [facility] ([address]---this is in the same building as [employer]).   You are scheduled on _______ at _______. You should arrive 15 minutes prior to your appointment time for registration. Please follow the written instructions below on the day of your exam:  WARNING: IF YOU ARE ALLERGIC TO IODINE/X-RAY DYE, PLEASE NOTIFY RADIOLOGY IMMEDIATELY AT [phone]! YOU WILL BE GIVEN A 13 HOUR PREMEDICATION PREP.  1) Do not eat or drink anything after _______ (4 hours prior to your test) 2) You have been given 2 bottles of oral contrast to drink. The solution may taste better if refrigerated, but do NOT add ice or any other liquid to this solution. Shake well before drinking.    Drink 1 bottle of contrast @ ________ (2 hours prior to your exam)  Drink 1 bottle of contrast @ _________ (1 hour prior to your exam)  You may take any medications as prescribed with a small amount of water, if necessary. If you take any of the following medications: METFORMIN, GLUCOPHAGE, GLUCOVANCE, AVANDAMET, RIOMET, FORTAMET, [place] MET, JANUMET, GLUMETZA or METAGLIP, you MAY be asked to HOLD this medication 48 hours AFTER the exam.  The purpose of you drinking the oral contrast is to aid in the visualization of your intestinal tract. The contrast solution may cause some diarrhea. Depending on your individual set of symptoms, you may also receive an intravenous injection of x-ray  contrast/dye. Plan on being at [facility] for 30 minutes or longer, depending on the type of exam you are having performed.  This test typically takes 30-45 minutes to complete.  If you have any questions regarding your exam or if you need to reschedule, you may call the CT department at [phone] between the hours of 8:00 am and 5:00 pm, Monday-Friday.  ________________________________________________________________________  Your provider has requested that you go to the basement level for lab work before leaving today. Press "B" on the elevator. The lab is located at the first door on the left as you exit the elevator.  Follow up pending the results of your CT, or as needed.  Thank you for entrusting me with your care and choosing [facility].  Amy Esterwood, PA-C

## 2021-02-15 NOTE — Progress Notes (Signed)
Subjective:    Patient ID: Gavin Taylor, male    DOB: [birth year], 20 y.o.   MRN: [id number]  HPI Gavin Taylor is a pleasant 20 year old African-American male, student, new to GI today referred by Gavin Becton, FNP, Gavin Taylor for evaluation of intermittent lower abdominal pain which has been occurring over the past 2 years.  He has not had any prior GI evaluation.  Patient says that he may go as long as a month without any symptoms, and then will have episodes of lower abdominal pain which may be present for anywhere from 2 to 3 days to a week.  He describes pain across the lower abdomen and in the suprapubic area which she describes as a pressure type feeling with tightness and a bloating sensation that is quite uncomfortable at times.  He does not have any associated changes in his bowel habits with these episodes, no complaints of melena or hematochezia.  No associated nausea or vomiting.  Appetite is generally okay even with the episodes and does not have any distinct increase in pain postprandially.  His weight has been stable.  He says he does not feel particularly well with these episodes and is unable to work out etc. as he has tenderness in the lower abdomen.  He has not had an episode over the past few weeks. Is not aware of any particular food intolerances or triggers for these episodes of pain. He has no complaints of dysuria urgency frequency or hematuria though says sometimes if his bladder is full with 1 of these episodes he may get some minimal relief with urination. He has apparently been told in the past that he might have diverticulitis and had been given a prescription for doxycycline in March 2022 which she says upset his stomach with nausea and he stopped it.  It did not offer any relief of his lower abdominal discomfort. No family history of inflammatory bowel disease or colon cancers. Patient generally in good health with history of mild asthma. He has not had any recent labs. He did  have upper abdominal ultrasound done by primary care March 2022 which was unremarkable  Review of Systems Pertinent positive and negative review of systems were noted in the above HPI section.  All other review of systems was otherwise negative.  Outpatient Encounter Medications as of 02/15/2021  Medication Sig  . albuterol (VENTOLIN HFA) 108 (90 Base) MCG/ACT inhaler Inhale 2 puffs into the lungs every 6 (six) hours as needed for wheezing or shortness of breath.  . diclofenac (VOLTAREN) 75 MG EC tablet Take 75 mg by mouth as needed.  Marland Kitchen ibuprofen (ADVIL) 800 MG tablet Take 1 tablet (800 mg total) by mouth every 8 (eight) hours as needed.  . [DISCONTINUED] doxycycline (VIBRA-TABS) 100 MG tablet Take 1 tablet (100 mg total) by mouth 2 (two) times daily.   No facility-administered encounter medications on file as of 02/15/2021.   Allergies  Allergen Reactions  . Omnicef [Cefdinir]    There are no problems to display for this patient.  Social History   Socioeconomic History  . Marital status: Single    Spouse name: Not on file  . Number of children: 0  . Years of education: Not on file  . Highest education level: Not on file  Occupational History  . Occupation: Ship broker  Tobacco Use  . Smoking status: Never Smoker  . Smokeless tobacco: Never Used  Vaping Use  . Vaping Use: Never used  Substance and Sexual Activity  .  Alcohol use: Never  . Drug use: Not Currently    Types: Marijuana  . Sexual activity: Not on file  Other Topics Concern  . Not on file  Social History Narrative  . Not on file   Social Determinants of Health   Financial Resource Strain: Not on file  Food Insecurity: Not on file  Transportation Needs: Not on file  Physical Activity: Not on file  Stress: Not on file  Social Connections: Not on file  Intimate Partner Violence: Not on file    [name] family history includes Asthma in his sister; Hypertension in his father.      Objective:    Vitals:    02/15/21 1522  BP: 100/64  Pulse: 68    Physical Exam Well-developed well-nourished young African-American male in no acute distress.  Height, Weight 140, BMI 20.7  HEENT; nontraumatic normocephalic, EOMI, PE R LA, sclera anicteric. Oropharynx; not examined today Neck; supple, no JVD Cardiovascular; regular rate and rhythm with S1-S2, no murmur rub or gallop Pulmonary; Clear bilaterally Abdomen; soft, nontender, nondistended, no palpable mass or hepatosplenomegaly, bowel sounds are active Rectal; not done today Skin; benign exam, no jaundice rash or appreciable lesions Extremities; no clubbing cyanosis or edema skin warm and dry Neuro/Psych; alert and oriented x4, grossly nonfocal mood and affect appropriate       Assessment & Plan:   #62 20 year old male with 2-year history of intermittent lower abdominal pain primarily in the suprapubic area radiating across bilaterally.  Patient will have episodes lasting from a couple of days to a week and describes tenderness, pressure tightness and a bloated sensation in the lower abdomen.  No associated changes in bowel habits no melena hematochezia nausea vomiting etc.  No change with p.o. intake. May go for as long as a month without symptoms No known triggers Weight stable Denies urinary symptoms  Etiology of patient's symptoms is not clear, suspect recurring intra-abdominal inflammatory process, rule out early IBD, cannot rule out bladder pathology.  Plan; CBC with differential, be met, sed rate/CRP Schedule for CT of the abdomen pelvis with contrast. If labs and CT unremarkable consider trial of glycopyrrolate/antispasmodic. Further recommendations pending results of above.    Gavin Taylor Genia Harold PA-C 02/15/2021   Cc: Gavin Glatter, FNP

## 2021-03-02 NOTE — Progress Notes (Signed)
Addendum: Reviewed and agree with assessment and management plan. Brandan Glauber M, MD  

## 2021-03-07 ENCOUNTER — Ambulatory Visit: Payer: BC Managed Care – PPO | Admitting: Gastroenterology

## 2021-03-16 ENCOUNTER — Inpatient Hospital Stay: Admission: RE | Admit: 2021-03-16 | Payer: BC Managed Care – PPO | Source: Ambulatory Visit

## 2021-04-08 ENCOUNTER — Ambulatory Visit: Payer: BC Managed Care – PPO | Admitting: Nurse Practitioner

## 2021-04-08 ENCOUNTER — Ambulatory Visit: Payer: BC Managed Care – PPO | Admitting: Family Medicine

## 2021-04-12 ENCOUNTER — Other Ambulatory Visit: Payer: Self-pay

## 2021-04-12 ENCOUNTER — Ambulatory Visit (INDEPENDENT_AMBULATORY_CARE_PROVIDER_SITE_OTHER)
Admission: RE | Admit: 2021-04-12 | Discharge: 2021-04-12 | Disposition: A | Discharge status: Home / Self Care | Payer: BC Managed Care – PPO | Source: Ambulatory Visit | Attending: Physician Assistant | Admitting: Physician Assistant

## 2021-04-12 DIAGNOSIS — R1031 Right lower quadrant pain: Secondary | ICD-10-CM

## 2021-04-12 DIAGNOSIS — R1032 Left lower quadrant pain: Secondary | ICD-10-CM

## 2021-04-12 DIAGNOSIS — R102 Pelvic and perineal pain: Secondary | ICD-10-CM | POA: Diagnosis not present

## 2021-04-12 DIAGNOSIS — R109 Unspecified abdominal pain: Secondary | ICD-10-CM | POA: Diagnosis not present

## 2021-04-12 MED ORDER — IOHEXOL 300 MG/ML  SOLN
80.0000 mL | Freq: Once | INTRAMUSCULAR | Status: AC | PRN
  Administered 2021-04-12: 80 mL via INTRAVENOUS

## 2021-04-13 ENCOUNTER — Other Ambulatory Visit: Payer: Self-pay | Admitting: *Deleted

## 2021-04-13 MED ORDER — GLYCOPYRROLATE 2 MG PO TABS: 2.0000 mg | ORAL_TABLET | Freq: Two times a day (BID) | ORAL | 1 refills | Status: DC | PRN

## 2021-05-31 ENCOUNTER — Other Ambulatory Visit (HOSPITAL_COMMUNITY)
Admission: RE | Admit: 2021-05-31 | Discharge: 2021-05-31 | Disposition: A | Discharge status: Home / Self Care | Payer: BC Managed Care – PPO | Source: Ambulatory Visit | Attending: Physician Assistant | Admitting: Physician Assistant

## 2021-05-31 ENCOUNTER — Ambulatory Visit: Payer: BC Managed Care – PPO | Admitting: Physician Assistant

## 2021-05-31 ENCOUNTER — Other Ambulatory Visit: Payer: Self-pay

## 2021-05-31 ENCOUNTER — Other Ambulatory Visit: Payer: BC Managed Care – PPO

## 2021-05-31 VITALS — BP 113/65 | HR 68 | Temp 98.5°F | Resp 16 | Ht 69.0 in | Wt 141.0 lb

## 2021-05-31 DIAGNOSIS — Z113 Encounter for screening for infections with a predominantly sexual mode of transmission: Secondary | ICD-10-CM | POA: Insufficient documentation

## 2021-05-31 NOTE — Patient Instructions (Signed)
We will call you with today's lab results.  Please let us know if there is anything else we can do for you  Roney Jaffe, PA-C Physician Assistant [facility] [email or web address]   Safe Sex Practicing safe sex means taking steps before and during sex to reduce your risk of: Getting an STI (sexually transmitted infection). Giving your partner an STI. Unwanted or unplanned pregnancy. How to practice safe sex Ways you can practice safe sex  Limit your sexual partners to only one partner who is having sex with only you. Avoid using alcohol and drugs before having sex. Alcohol and drugs can affect your judgment. Before having sex with a new partner: Talk to your partner about past partners, past STIs, and drug use. Get screened for STIs and discuss the results with your partner. Ask your partner to get screened too. Check your body regularly for sores, blisters, rashes, or unusual discharge. If you notice any of these problems, visit your health care provider. Avoid sexual contact if you have symptoms of an infection or you are being treated for an STI. While having sex, use a condom. Make sure to: Use a condom every time you have vaginal, oral, or anal sex. Both females and males should wear condoms during oral sex. Keep condoms in place from the beginning to the end of sexual activity. Use a latex condom, if possible. Latex condoms offer the best protection. Use only water-based lubricants with a condom. Using petroleum-based lubricants or oils will weaken the condom and increase the chance that it will break.  Ways your health care provider can help you practice safe sex  See your health care provider for regular screenings, exams, and tests for STIs. Talk with your health care provider about what kind of birth control (contraception) is best for you. Get vaccinated against hepatitis B and human papillomavirus (HPV). If you  are at risk of being infected with HIV (human immunodeficiency virus), talk with your health care provider about taking a prescription medicine to prevent HIV infection. You are at risk for HIV if you: Are a man who has sex with other men. Are sexually active with more than one partner. Take drugs by injection. Have a sex partner who has HIV. Have unprotected sex. Have sex with someone who has sex with both men and women. Have had an STI.  Follow these instructions at home: Take over-the-counter and prescription medicines only as told by your health care provider. Keep all follow-up visits. This is important. Where to find more information Centers for Disease Control and Prevention: FootballExhibition.com.br Planned Parenthood: www.plannedparenthood.org Office on [employer] [facility]: http://hoffman.com/ Summary Practicing safe sex means taking steps before and during sex to reduce your risk getting an STI, giving your partner an STI, and having an unwanted or unplanned pregnancy. Before having sex with a new partner, talk to your partner about past partners, past STIs, and drug use. Use a condom every time you have vaginal, oral, or anal sex. Both females and males should wear condoms during oral sex. Check your body regularly for sores, blisters, rashes, or unusual discharge. If you notice any of these problems, visit your health care provider. See your health care provider for regular screenings, exams, and tests for STIs. This information is not intended to replace advice given to you by your health care provider. Make sure you discuss any questions you have with your healthcare provider. Document Revised: 03/15/2020 Document Reviewed: 03/15/2020 Elsevier Patient Education  2022 [employer].

## 2021-05-31 NOTE — Progress Notes (Signed)
STD screening

## 2021-05-31 NOTE — Progress Notes (Signed)
Established Patient Office Visit  Subjective:  Patient ID: Gavin Taylor, male    DOB: Oct 23, 2001  Age: 20 y.o. MRN: [id number]  CC:  Chief Complaint  Patient presents with   screen sti     HPI Gavin Taylor request screening for sexually transmitted infections.  Denies any known exposures, any STI symptoms.  No other concerns at this time   Past Medical History:  Diagnosis Date   Asthma    Diverticulitis    Frequent headaches     Past Surgical History:  Procedure Laterality Date   WISDOM TOOTH EXTRACTION     2019    Family History  Problem Relation Age of Onset   Hypertension Father    Asthma Sister     Social History   Socioeconomic History   Marital status: Single    Spouse name: Not on file   Number of children: 0   Years of education: Not on file   Highest education level: Not on file  Occupational History   Occupation: student  Tobacco Use   Smoking status: Never   Smokeless tobacco: Never  Vaping Use   Vaping Use: Never used  Substance and Sexual Activity   Alcohol use: Never   Drug use: Not Currently    Types: Marijuana   Sexual activity: Not on file  Other Topics Concern   Not on file  Social History Narrative   Not on file   Social Determinants of Health   Financial Resource Strain: Not on file  Food Insecurity: Not on file  Transportation Needs: Not on file  Physical Activity: Not on file  Stress: Not on file  Social Connections: Not on file  Intimate Partner Violence: Not on file    Outpatient Medications Prior to Visit  Medication Sig Dispense Refill   albuterol (VENTOLIN HFA) 108 (90 Base) MCG/ACT inhaler Inhale 2 puffs into the lungs every 6 (six) hours as needed for wheezing or shortness of breath. 8 g 11   diclofenac (VOLTAREN) 75 MG EC tablet Take 75 mg by mouth as needed.     ibuprofen (ADVIL) 800 MG tablet Take 1 tablet (800 mg total) by mouth every 8 (eight) hours as needed. 30 tablet 3   glycopyrrolate (ROBINUL) 2 MG tablet  Take 1 tablet (2 mg total) by mouth 2 (two) times daily as needed. 60 tablet 1   No facility-administered medications prior to visit.    Allergies  Allergen Reactions   Omnicef [Cefdinir]     ROS Review of Systems  Constitutional: Negative.   HENT: Negative.    Eyes: Negative.   Respiratory:  Negative for shortness of breath.   Cardiovascular:  Negative for chest pain.  Gastrointestinal: Negative.   Endocrine: Negative.   Genitourinary:  Negative for dysuria, genital sores, hematuria and penile discharge.  Musculoskeletal: Negative.   Skin: Negative.   Allergic/Immunologic: Negative.   Neurological: Negative.   Hematological: Negative.   Psychiatric/Behavioral: Negative.       Objective:    Physical Exam Vitals and nursing note reviewed.  Constitutional:      Appearance: Normal appearance.  HENT:     Head: Normocephalic and atraumatic.     Right Ear: External ear normal.     Left Ear: External ear normal.     Nose: Nose normal.     Mouth/Throat:     Mouth: Mucous membranes are moist.     Pharynx: Oropharynx is clear.  Eyes:     Extraocular Movements: Extraocular movements intact.  Conjunctiva/sclera: Conjunctivae normal.     Pupils: Pupils are equal, round, and reactive to light.  Cardiovascular:     Rate and Rhythm: Normal rate and regular rhythm.     Pulses: Normal pulses.     Heart sounds: Normal heart sounds.  Pulmonary:     Effort: Pulmonary effort is normal.     Breath sounds: Normal breath sounds.  Musculoskeletal:        General: Normal range of motion.     Cervical back: Normal range of motion and neck supple.  Skin:    General: Skin is warm and dry.  Neurological:     General: No focal deficit present.     Mental Status: He is alert and oriented to person, place, and time.  Psychiatric:        Mood and Affect: Mood normal.        Behavior: Behavior normal.        Thought Content: Thought content normal.        Judgment: Judgment normal.     BP 113/65 (BP Location: Left Arm, Patient Position: Sitting, Cuff Size: Normal)   Pulse 68   Temp 98.5 F (36.9 C)   Resp 16   Ht 5\' 9"  (1.753 m)   Wt 141 lb (64 kg)   SpO2 100%   BMI 20.82 kg/m  Wt Readings from Last 3 Encounters:  05/31/21 141 lb (64 kg)  02/15/21 140 lb 4 oz (63.6 kg)  01/05/21 142 lb (64.4 kg)     Health Maintenance Due  Topic Date Due   HPV VACCINES (1 - Male 2-dose series) Never done   HIV Screening  Never done   Hepatitis C Screening  Never done   TETANUS/TDAP  Never done   COVID-19 Vaccine (3 - Booster for Moderna series) 07/12/2020   INFLUENZA VACCINE  05/23/2021       Topic Date Due   HPV VACCINES (1 - Male 2-dose series) Never done    No results found for: TSH Lab Results  Component Value Date   WBC 5.1 02/15/2021   HGB 15.4 02/15/2021   HCT 45.7 02/15/2021   MCV 89.7 02/15/2021   PLT 173.0 02/15/2021   Lab Results  Component Value Date   NA 139 02/15/2021   K 3.5 02/15/2021   CO2 30 02/15/2021   GLUCOSE 94 02/15/2021   BUN 17 02/15/2021   CREATININE 1.01 02/15/2021   BILITOT <0.2 10/08/2020   ALKPHOS 53 10/08/2020   AST 18 10/08/2020   ALT 13 10/08/2020   PROT 7.8 10/08/2020   ALBUMIN 4.3 10/08/2020   CALCIUM 9.5 02/15/2021   GFR 106.96 02/15/2021   No results found for: CHOL No results found for: HDL No results found for: LDLCALC No results found for: TRIG No results found for: CHOLHDL No results found for: 02/17/2021    Assessment & Plan:   Problem List Items Addressed This Visit   None Visit Diagnoses     Encounter for screening examination for sexually transmitted disease    -  Primary   Relevant Orders   Urine cytology ancillary only       No orders of the defined types were placed in this encounter. 1. Encounter for screening examination for sexually transmitted disease Patient education given on safe sex practices.  Patient refuses blood draw to screen for HIV, syphilis and herpes. - Urine cytology  ancillary only   I have reviewed the patient's medical history (PMH, PSH, Social History, Family History,  Medications, and allergies) , and have been updated if relevant. I spent 20 minutes reviewing chart and  face to face time with patient.    Follow-up: Return if symptoms worsen or fail to improve.    Kasandra Knudsen Mayers, PA-C

## 2021-06-01 LAB — URINE CYTOLOGY ANCILLARY ONLY
Candida Urine: NEGATIVE
Chlamydia: NEGATIVE
Comment: NEGATIVE
Comment: NEGATIVE
Comment: NORMAL
Neisseria Gonorrhea: NEGATIVE
Trichomonas: NEGATIVE

## 2021-08-22 DIAGNOSIS — R0981 Nasal congestion: Secondary | ICD-10-CM | POA: Diagnosis not present

## 2021-08-23 DIAGNOSIS — J111 Influenza due to unidentified influenza virus with other respiratory manifestations: Secondary | ICD-10-CM | POA: Diagnosis not present

## 2021-08-23 DIAGNOSIS — J029 Acute pharyngitis, unspecified: Secondary | ICD-10-CM | POA: Diagnosis not present

## 2021-08-23 DIAGNOSIS — R1032 Left lower quadrant pain: Secondary | ICD-10-CM | POA: Diagnosis not present

## 2021-10-04 ENCOUNTER — Ambulatory Visit: Payer: BC Managed Care – PPO | Admitting: Family Medicine

## 2021-10-04 ENCOUNTER — Encounter: Payer: Self-pay | Admitting: Nurse Practitioner

## 2021-10-04 ENCOUNTER — Other Ambulatory Visit: Payer: Self-pay

## 2021-10-04 ENCOUNTER — Ambulatory Visit (INDEPENDENT_AMBULATORY_CARE_PROVIDER_SITE_OTHER): Payer: BC Managed Care – PPO | Admitting: Nurse Practitioner

## 2021-10-04 VITALS — BP 109/59 | HR 54 | Temp 98.6°F | Ht 69.0 in | Wt 143.0 lb

## 2021-10-04 DIAGNOSIS — Z0000 Encounter for general adult medical examination without abnormal findings: Secondary | ICD-10-CM | POA: Diagnosis not present

## 2021-10-04 NOTE — Progress Notes (Signed)
[facility] Patient [facility] [address] [place], [place]  [address] Phone:  [phone]   Fax:  [phone] Subjective:   Patient ID: Gavin Taylor, male    DOB: 2001-06-04, 20 y.o.   MRN: [id number]  Chief Complaint  Patient presents with   Follow-up    No questions or concerns, just check up.    HPI Maximillian Habibi 20 y.o. male presents with history of asthma to the [facility] for annual wellness visit.   Patient denies any complaints today, states that he has only had to use albuterol inhaler 1-2 x this year, when he had the flu. Denies any frequent asthma exacerbations.  Generally eats very healthy and runs a mile several times during the week. Also endorses lifting weights. 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 Denies any other complaints today. Denies any alcohol usage, substance abuse or tobacco abuse.  Denies any fever. Denies any fatigue, chest pain, shortness of breath, HA or dizziness. Denies any blurred vision, numbness or tingling.  Past Medical History:  Diagnosis Date   Asthma    Diverticulitis    Frequent headaches     Past Surgical History:  Procedure Laterality Date   WISDOM TOOTH EXTRACTION     2019    Family History  Problem Relation Age of Onset   Hypertension Father    Asthma Sister     Social History   Socioeconomic History   Marital status: Single    Spouse name: Not on file   Number of children: 0   Years of education: Not on file   Highest education level: Not on file  Occupational History   Occupation: student  Tobacco Use   Smoking status: Never   Smokeless tobacco: Never  Vaping Use   Vaping Use: Never used  Substance and Sexual Activity   Alcohol use: Yes    Comment: occ   Drug use: Not Currently    Types: Marijuana   Sexual activity: Not on file  Other Topics Concern   Not on file  Social History Narrative   Not on file   Social Determinants of  Health   Financial Resource Strain: Not on file  Food Insecurity: Not on file  Transportation Needs: Not on file  Physical Activity: Not on file  Stress: Not on file  Social Connections: Not on file  Intimate Partner Violence: Not on file    Outpatient Medications Prior to Visit  Medication Sig Dispense Refill   albuterol (VENTOLIN HFA) 108 (90 Base) MCG/ACT inhaler Inhale 2 puffs into the lungs every 6 (six) hours as needed for wheezing or shortness of breath. 8 g 11   diclofenac (VOLTAREN) 75 MG EC tablet Take 75 mg by mouth as needed.     glycopyrrolate (ROBINUL) 2 MG tablet Take 1 tablet (2 mg total) by mouth 2 (two) times daily as needed. 60 tablet 1   ibuprofen (ADVIL) 800 MG tablet Take 1 tablet (800 mg total) by mouth every 8 (eight) hours as needed. 30 tablet 3   No facility-administered medications prior to visit.    Allergies  Allergen Reactions   Cefdinir     Other reaction(s): Rash    Review of Systems  Constitutional:  Negative for chills, fever and malaise/fatigue.  HENT: Negative.    Eyes: Negative.   Respiratory:  Negative for cough and shortness of breath.   Cardiovascular:  Negative for chest pain, palpitations  and leg swelling.  Gastrointestinal:  Negative for abdominal pain, blood in stool, constipation, diarrhea, nausea and vomiting.  Genitourinary: Negative.   Musculoskeletal: Negative.   Skin: Negative.   Neurological: Negative.   Psychiatric/Behavioral:  Negative for depression. The patient is not nervous/anxious.   All other systems reviewed and are negative.     Objective:    Physical Exam Vitals reviewed.  Constitutional:      General: He is not in acute distress.    Appearance: Normal appearance. He is normal weight.  HENT:     Head: Normocephalic.     Right Ear: Tympanic membrane, ear canal and external ear normal.     Left Ear: Tympanic membrane, ear canal and external ear normal.     Nose: Nose normal.     Mouth/Throat:     Mouth:  Mucous membranes are moist.     Pharynx: Oropharynx is clear.  Eyes:     Extraocular Movements: Extraocular movements intact.     Conjunctiva/sclera: Conjunctivae normal.     Pupils: Pupils are equal, round, and reactive to light.  Cardiovascular:     Rate and Rhythm: Normal rate and regular rhythm.     Pulses: Normal pulses.     Heart sounds: Normal heart sounds.     Comments: No obvious peripheral edema Pulmonary:     Effort: Pulmonary effort is normal.     Breath sounds: Normal breath sounds.  Abdominal:     General: Abdomen is flat. Bowel sounds are normal.     Palpations: Abdomen is soft.  Musculoskeletal:        General: No swelling, tenderness, deformity or signs of injury. Normal range of motion.     Cervical back: Normal range of motion and neck supple.     Right lower leg: No edema.     Left lower leg: No edema.  Skin:    General: Skin is warm and dry.     Capillary Refill: Capillary refill takes less than 2 seconds.  Neurological:     General: No focal deficit present.     Mental Status: He is alert and oriented to person, place, and time.  Psychiatric:        Mood and Affect: Mood normal.        Behavior: Behavior normal.        Thought Content: Thought content normal.        Judgment: Judgment normal.    BP (!) 109/59 (BP Location: Left Arm, Patient Position: Sitting)    Pulse (!) 54    Temp 98.6 F (37 C)    Ht 5\' 9"  (1.753 m)    Wt 143 lb 0.2 oz (64.9 kg)    SpO2 99%    BMI 21.12 kg/m  Wt Readings from Last 3 Encounters:  10/04/21 143 lb 0.2 oz (64.9 kg)  05/31/21 141 lb (64 kg)  02/15/21 140 lb 4 oz (63.6 kg)    Immunization History  Administered Date(s) Administered   Moderna Sars-Covid-2 Vaccination 01/08/2020, 02/10/2020   Unspecified SARS-COV-2 Vaccination 04/06/2021    Diabetic Foot Exam - Simple   No data filed     No results found for: TSH Lab Results  Component Value Date   WBC 5.1 02/15/2021   HGB 15.4 02/15/2021   HCT 45.7  02/15/2021   MCV 89.7 02/15/2021   PLT 173.0 02/15/2021   Lab Results  Component Value Date   NA 139 02/15/2021   K 3.5 02/15/2021   CO2 30  02/15/2021   GLUCOSE 94 02/15/2021   BUN 17 02/15/2021   CREATININE 1.01 02/15/2021   BILITOT <0.2 10/08/2020   ALKPHOS 53 10/08/2020   AST 18 10/08/2020   ALT 13 10/08/2020   PROT 7.8 10/08/2020   ALBUMIN 4.3 10/08/2020   CALCIUM 9.5 02/15/2021   GFR 106.96 02/15/2021   No results found for: CHOL No results found for: HDL No results found for: LDLCALC No results found for: TRIG No results found for: CHOLHDL No results found for: ELFY1O     Assessment & Plan:   Problem List Items Addressed This Visit   None Visit Diagnoses     Healthcare maintenance    -  Primary   Relevant Orders   CBC with Differential/Platelet   Comprehensive metabolic panel   Hemoglobin A1c   Lipid panel Encouraged to continue diet and exercise efforts   Follow up in 1 yr for annual wellness visit, sooner as needed    I am having Leta Speller maintain his diclofenac, ibuprofen, albuterol, and glycopyrrolate.  No orders of the defined types were placed in this encounter.    Kathrynn Speed, NP

## 2021-10-04 NOTE — Patient Instructions (Signed)
You were seen today in the [facility] for annual wellness exam. Labs were collected, results will be available via MyChart or, if abnormal, you will be contacted by clinic staff. Please follow up in 1 yrs for annual wellness.

## 2021-10-05 ENCOUNTER — Encounter: Payer: BC Managed Care – PPO | Admitting: Nurse Practitioner

## 2021-10-05 LAB — CBC WITH DIFFERENTIAL/PLATELET
Basophils Absolute: 0 10*3/uL (ref 0.0–0.2)
Basos: 0 %
EOS (ABSOLUTE): 0.1 10*3/uL (ref 0.0–0.4)
Eos: 4 %
Hematocrit: 45.8 % (ref 37.5–51.0)
Hemoglobin: 15.9 g/dL (ref 13.0–17.7)
Immature Grans (Abs): 0 10*3/uL (ref 0.0–0.1)
Immature Granulocytes: 0 %
Lymphocytes Absolute: 1.3 10*3/uL (ref 0.7–3.1)
Lymphs: 37 %
MCH: 31.1 pg (ref 26.6–33.0)
MCHC: 34.7 g/dL (ref 31.5–35.7)
MCV: 90 fL (ref 79–97)
Monocytes Absolute: 0.3 10*3/uL (ref 0.1–0.9)
Monocytes: 8 %
Neutrophils Absolute: 1.8 10*3/uL (ref 1.4–7.0)
Neutrophils: 51 %
Platelets: 199 10*3/uL (ref 150–450)
RBC: 5.12 x10E6/uL (ref 4.14–5.80)
RDW: 12.6 % (ref 11.6–15.4)
WBC: 3.5 10*3/uL (ref 3.4–10.8)

## 2021-10-05 LAB — COMPREHENSIVE METABOLIC PANEL
ALT: 15 IU/L (ref 0–44)
AST: 22 IU/L (ref 0–40)
Albumin/Globulin Ratio: 1.6 (ref 1.2–2.2)
Albumin: 4.7 g/dL (ref 4.1–5.2)
Alkaline Phosphatase: 53 IU/L (ref 44–121)
BUN/Creatinine Ratio: 21 — ABNORMAL HIGH (ref 9–20)
BUN: 21 mg/dL — ABNORMAL HIGH (ref 6–20)
Bilirubin Total: 0.5 mg/dL (ref 0.0–1.2)
CO2: 26 mmol/L (ref 20–29)
Calcium: 9.8 mg/dL (ref 8.7–10.2)
Chloride: 100 mmol/L (ref 96–106)
Creatinine, Ser: 0.99 mg/dL (ref 0.76–1.27)
Globulin, Total: 3 g/dL (ref 1.5–4.5)
Glucose: 75 mg/dL (ref 70–99)
Potassium: 4.7 mmol/L (ref 3.5–5.2)
Sodium: 139 mmol/L (ref 134–144)
Total Protein: 7.7 g/dL (ref 6.0–8.5)
eGFR: 111 mL/min/{1.73_m2} (ref >59)

## 2021-10-05 LAB — LIPID PANEL
Chol/HDL Ratio: 2.2 ratio (ref 0.0–5.0)
Cholesterol, Total: 129 mg/dL (ref 100–199)
HDL: 60 mg/dL (ref >39)
LDL Chol Calc (NIH): 59 mg/dL (ref 0–99)
Triglycerides: 40 mg/dL (ref 0–149)
VLDL Cholesterol Cal: 10 mg/dL (ref 5–40)

## 2021-10-05 LAB — HEMOGLOBIN A1C
Est. average glucose Bld gHb Est-mCnc: 111 mg/dL
Hgb A1c MFr Bld: 5.5 % (ref 4.8–5.6)

## 2022-02-14 DIAGNOSIS — R933 Abnormal findings on diagnostic imaging of other parts of digestive tract: Secondary | ICD-10-CM | POA: Diagnosis not present

## 2022-02-14 DIAGNOSIS — R1030 Lower abdominal pain, unspecified: Secondary | ICD-10-CM | POA: Diagnosis not present

## 2022-02-14 DIAGNOSIS — R109 Unspecified abdominal pain: Secondary | ICD-10-CM | POA: Diagnosis not present

## 2022-03-27 DIAGNOSIS — R109 Unspecified abdominal pain: Secondary | ICD-10-CM | POA: Diagnosis not present

## 2022-03-27 DIAGNOSIS — R1030 Lower abdominal pain, unspecified: Secondary | ICD-10-CM | POA: Diagnosis not present

## 2022-03-27 DIAGNOSIS — R933 Abnormal findings on diagnostic imaging of other parts of digestive tract: Secondary | ICD-10-CM | POA: Diagnosis not present

## 2022-03-27 DIAGNOSIS — K5900 Constipation, unspecified: Secondary | ICD-10-CM | POA: Diagnosis not present

## 2022-04-14 ENCOUNTER — Ambulatory Visit (INDEPENDENT_AMBULATORY_CARE_PROVIDER_SITE_OTHER): Payer: BC Managed Care – PPO | Admitting: Nurse Practitioner

## 2022-04-14 ENCOUNTER — Encounter: Payer: Self-pay | Admitting: Nurse Practitioner

## 2022-04-14 VITALS — BP 116/66 | HR 71 | Temp 97.6°F | Ht 69.0 in | Wt 148.8 lb

## 2022-04-14 DIAGNOSIS — Z113 Encounter for screening for infections with a predominantly sexual mode of transmission: Secondary | ICD-10-CM

## 2022-04-14 DIAGNOSIS — Z0000 Encounter for general adult medical examination without abnormal findings: Secondary | ICD-10-CM | POA: Diagnosis not present

## 2022-04-14 LAB — POCT URINALYSIS DIP (CLINITEK)
Bilirubin, UA: NEGATIVE
Blood, UA: NEGATIVE
Glucose, UA: NEGATIVE mg/dL
Ketones, POC UA: NEGATIVE mg/dL
Leukocytes, UA: NEGATIVE
Nitrite, UA: NEGATIVE
POC PROTEIN,UA: NEGATIVE
Spec Grav, UA: >=1.03 — AB (ref 1.010–1.025)
Urobilinogen, UA: 0.2 E.U./dL
pH, UA: 6.5 (ref 5.0–8.0)

## 2022-04-14 NOTE — Assessment & Plan Note (Signed)
-   POCT URINALYSIS DIP (CLINITEK) - Chlamydia/Gonococcus/Trichomonas, NAA - HIV antibody (with reflex)  2. Screening for STD (sexually transmitted disease)  - Chlamydia/Gonococcus/Trichomonas, NAA - HIV antibody (with reflex)    Follow up:  Follow up in 1 year or sooner if needed

## 2022-04-15 LAB — HIV ANTIBODY (ROUTINE TESTING W REFLEX): HIV Screen 4th Generation wRfx: NONREACTIVE

## 2022-04-17 LAB — CHLAMYDIA/GONOCOCCUS/TRICHOMONAS, NAA
Chlamydia by NAA: NEGATIVE
Gonococcus by NAA: NEGATIVE
Trich vag by NAA: NEGATIVE

## 2022-04-21 ENCOUNTER — Encounter: Payer: BC Managed Care – PPO | Admitting: Nurse Practitioner

## 2022-08-26 IMAGING — US US ABDOMEN COMPLETE
1 series · 14 of 25 positions shown · non-contrast
Comparison: None.

CLINICAL DATA: Abdominal pain, history of diverticulitis.

EXAM:
ABDOMEN ULTRASOUND COMPLETE

[Series 1: us abdomen complete · 14 of 102 slices shown]
[im 1/102]
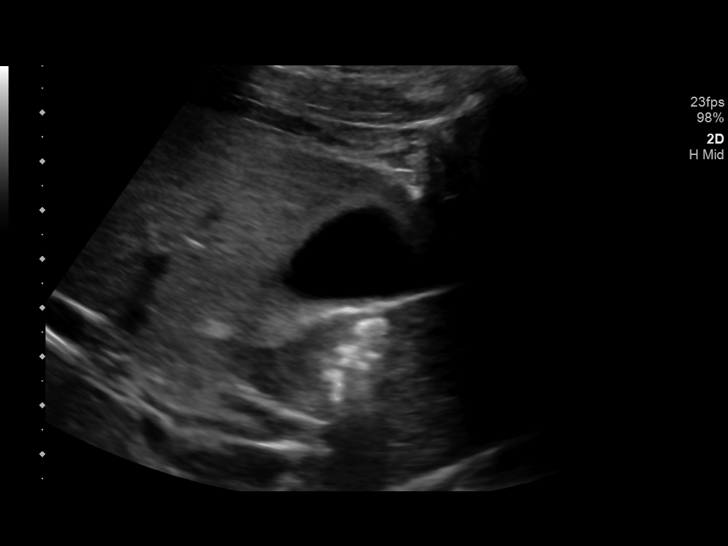
[im 9/102]
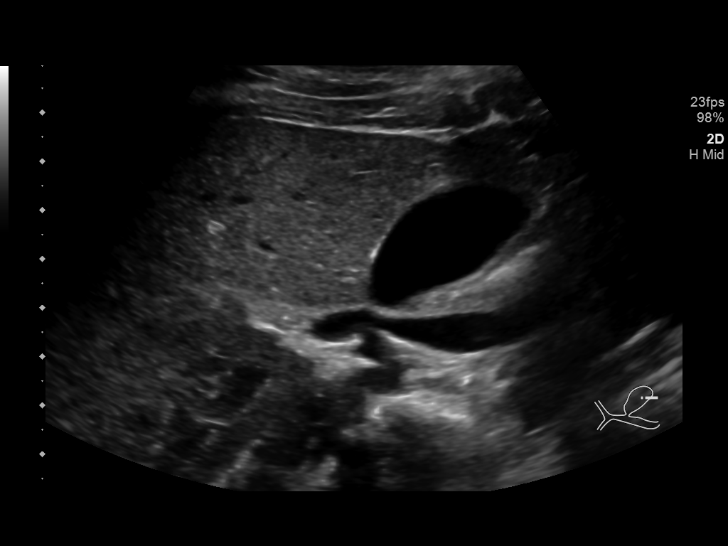
[im 17/102]
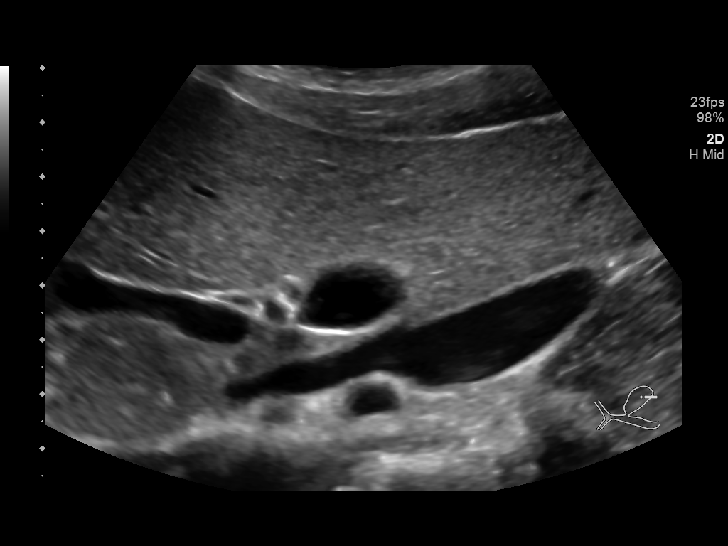
[im 26/102]
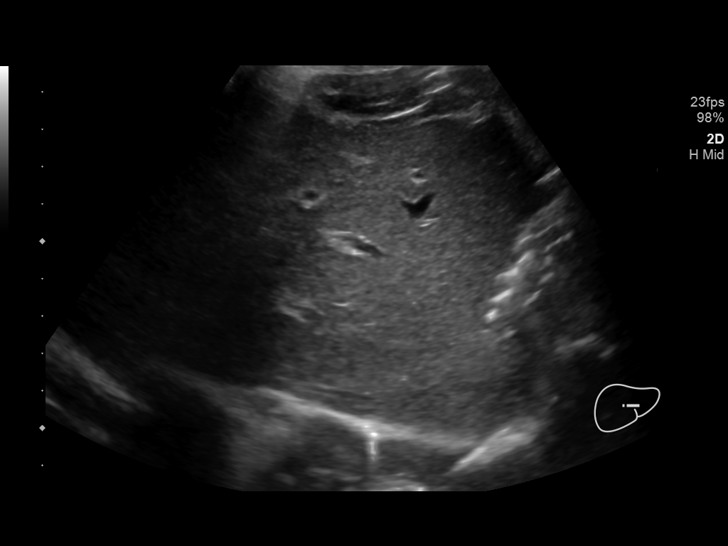
[im 34/102]
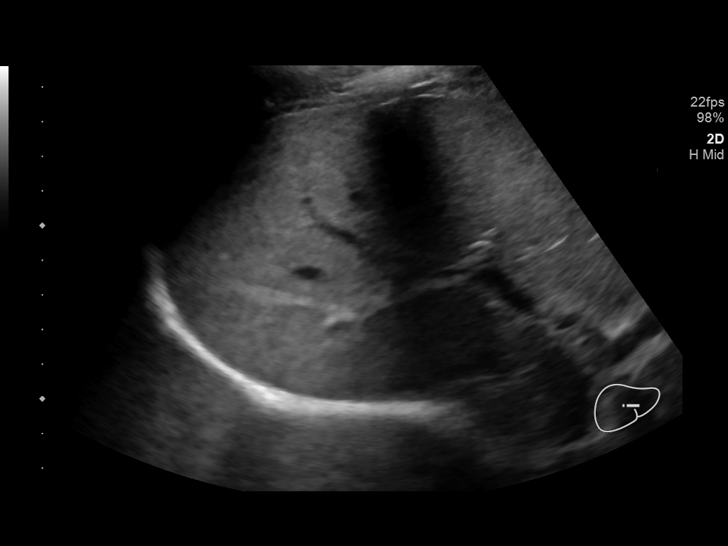
[im 38/102]
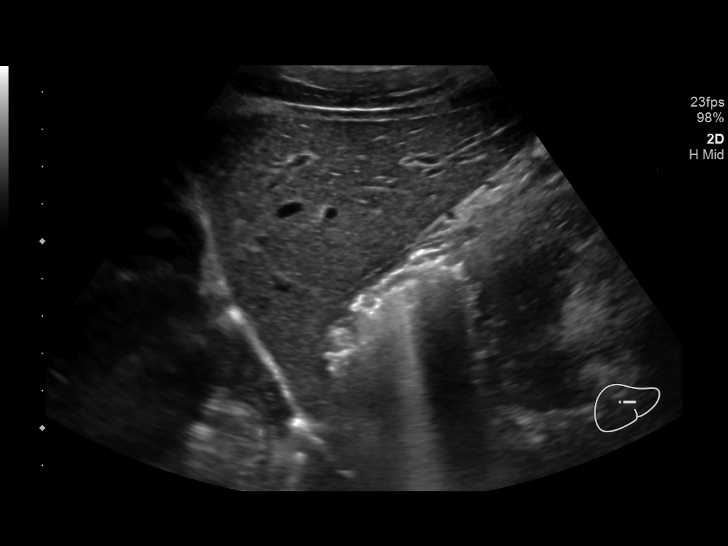
[im 47/102]
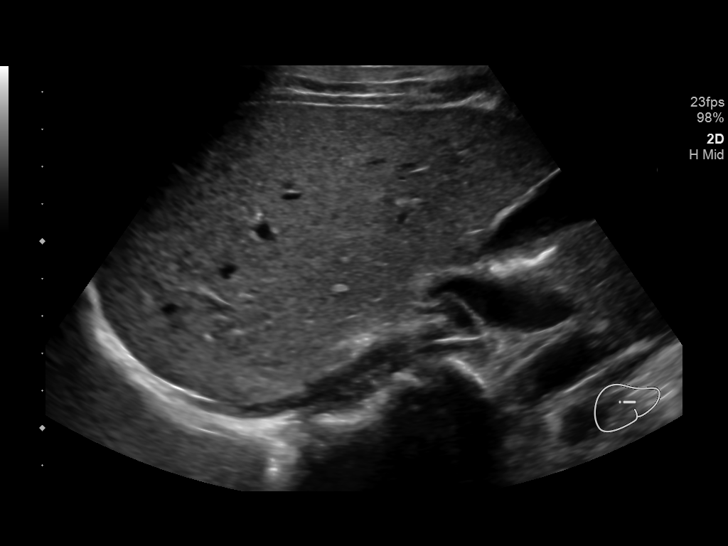
[im 55/102]
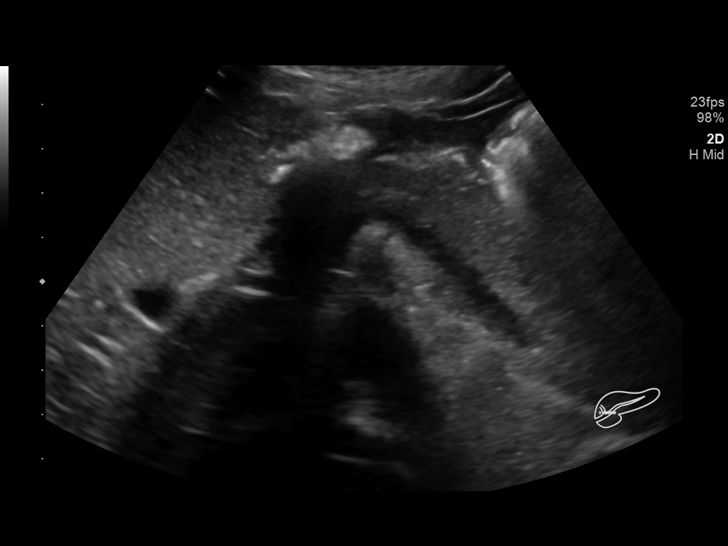
[im 64/102]
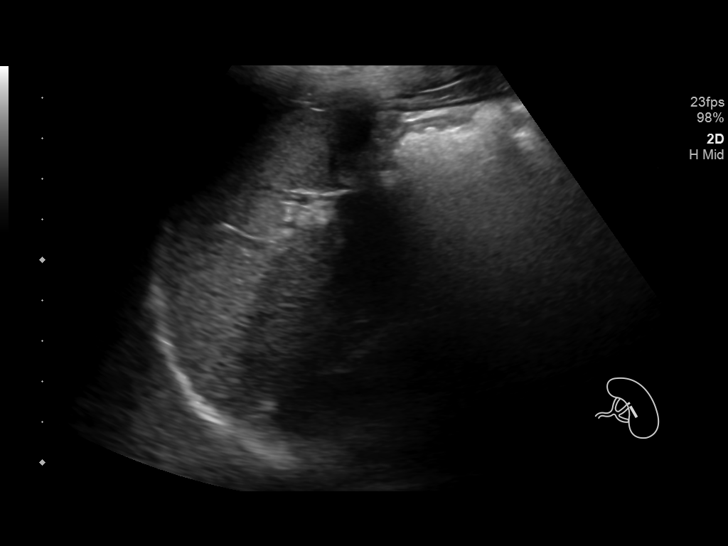
[im 68/102]
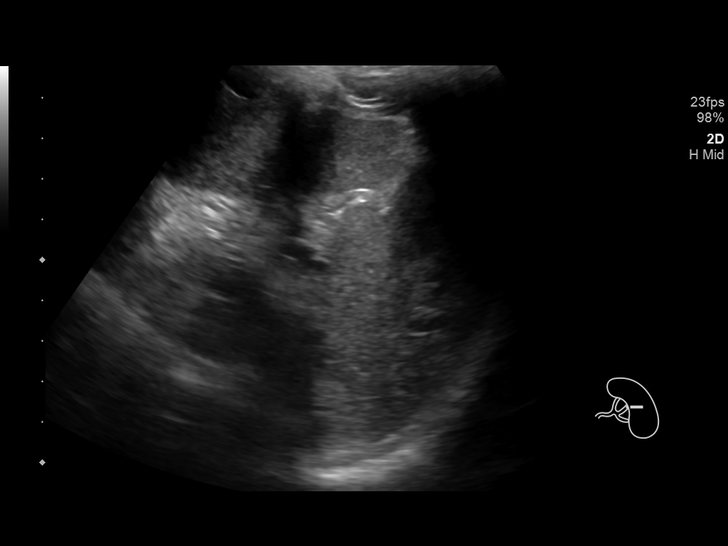
[im 76/102]
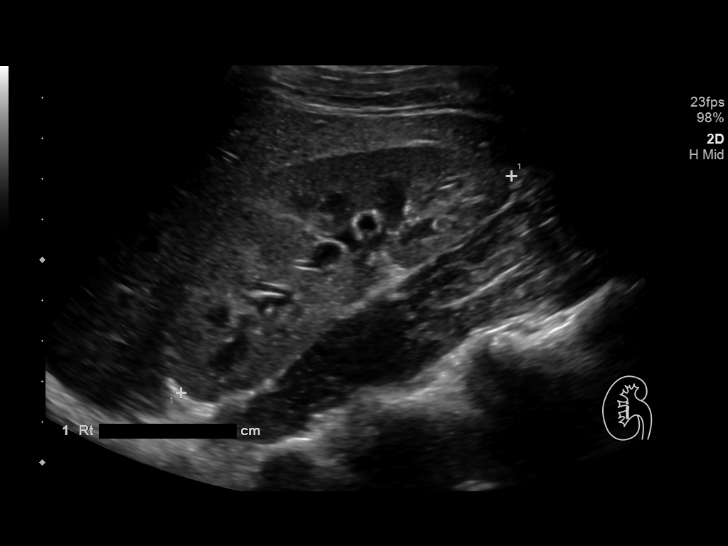
[im 85/102]
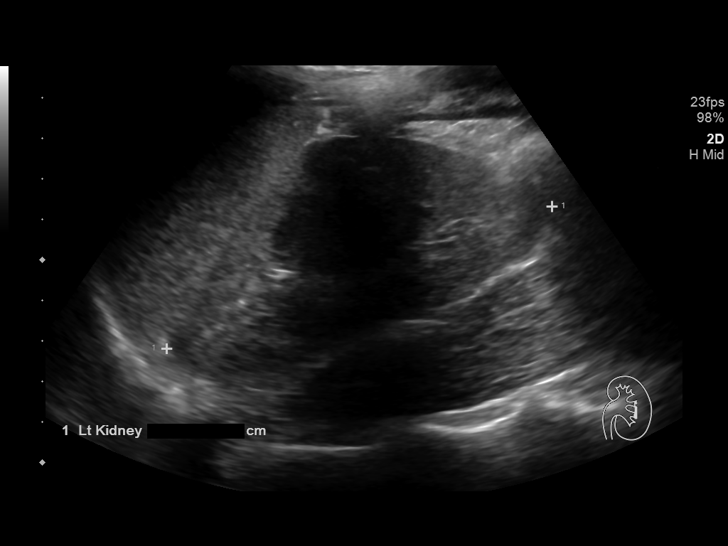
[im 93/102]
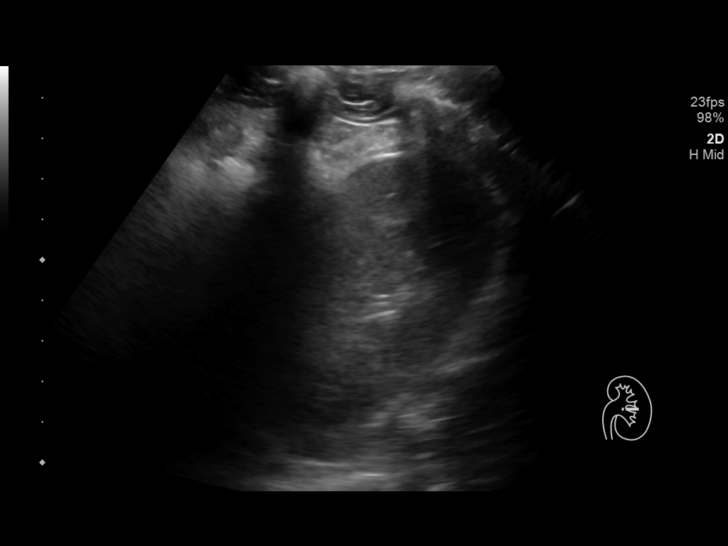
[im 102/102]
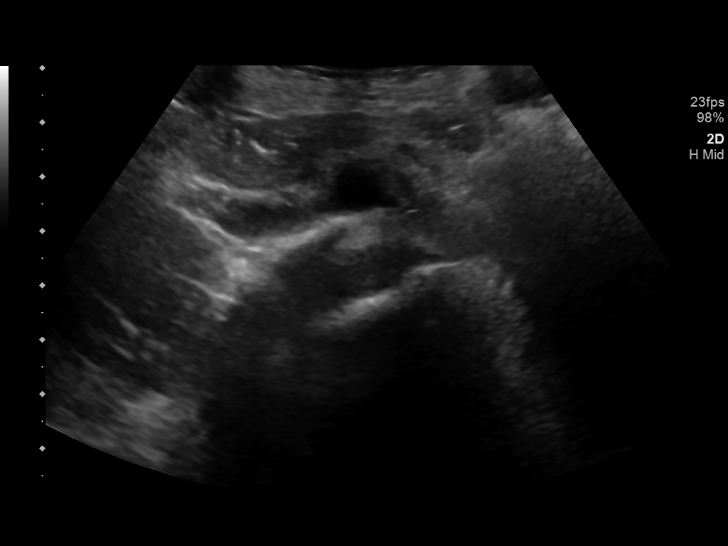

[14 of 25 positions shown; findings below may reference images not displayed]

FINDINGS: Gallbladder: No gallstones or wall thickening visualized. No
sonographic Murphy sign noted by sonographer.

Common bile duct: Diameter: 3 mm

Liver: No focal lesion identified. Within normal limits in
parenchymal echogenicity. Portal vein is patent on color Doppler
imaging with normal direction of blood flow towards the liver.

IVC: No abnormality visualized.

Pancreas: Visualized portion unremarkable.

Spleen: Size and appearance within normal limits.

Right Kidney: Length: 9.8 cm. Echogenicity within normal limits. No
mass or hydronephrosis visualized.

Left Kidney: Length: 10.1 cm. Echogenicity within normal limits. No
mass or hydronephrosis visualized.

Abdominal aorta: No aneurysm visualized.

Other findings: None.
IMPRESSION: Unremarkable ultrasound of the abdomen. Please note abdominal
ultrasound is relatively insensitive for the diagnosis of
diverticulitis.

## 2022-11-24 IMAGING — CT CT ABD-PELV W/ CM
2 of 4 series · 16 of 46 positions shown, 18 images · IV contrast (OMNIPAQUE 300)
Comparison: None.

CLINICAL DATA: Lower abdominal pain and pressure for 1 year.

EXAM:
CT ABDOMEN AND PELVIS WITH CONTRAST
TECHNIQUE: Multidetector CT imaging of the abdomen and pelvis was performed
using the standard protocol following bolus administration of
intravenous contrast.
CONTRAST:  80mL OMNIPAQUE IOHEXOL 300 MG/ML  SOLN

[Series 2: abd/pel w · axial · 0.66mm/px · z∈[-487,-82]mm · 13 of 89 slices shown, 15 images]
[im 4/89  soft-tissue]
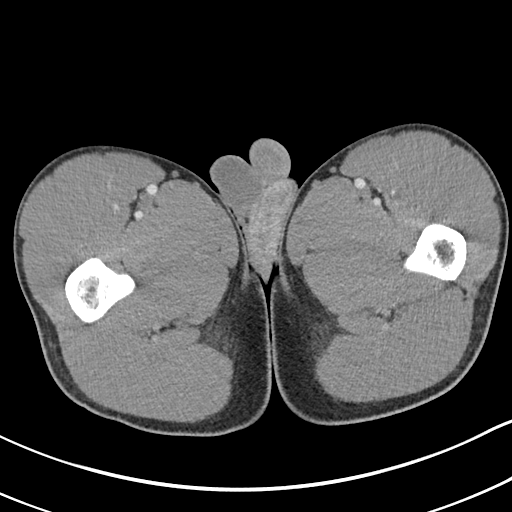
[im 4/89  bone]
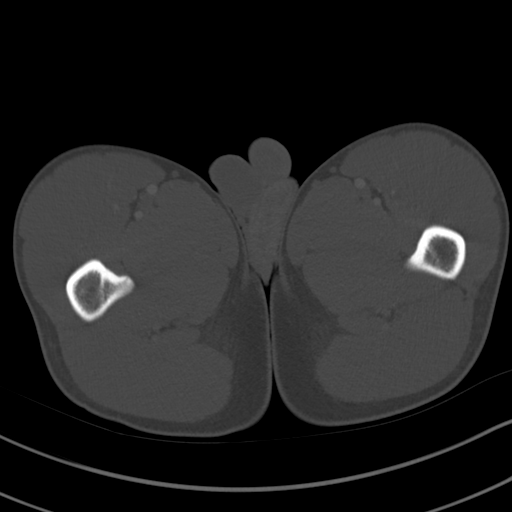
[im 12/89  soft-tissue]
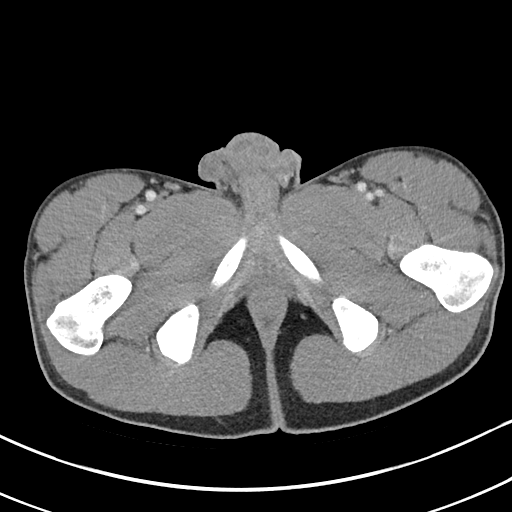
[im 19/89  soft-tissue]
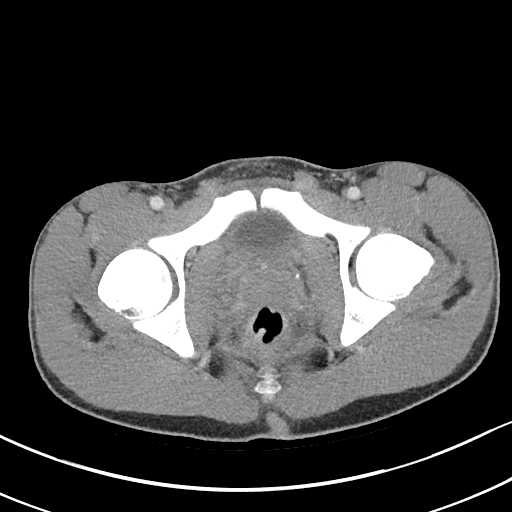
[im 26/89  soft-tissue]
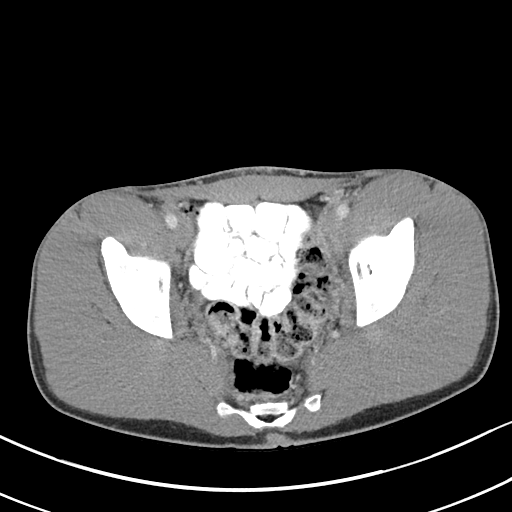
[im 30/89  soft-tissue]
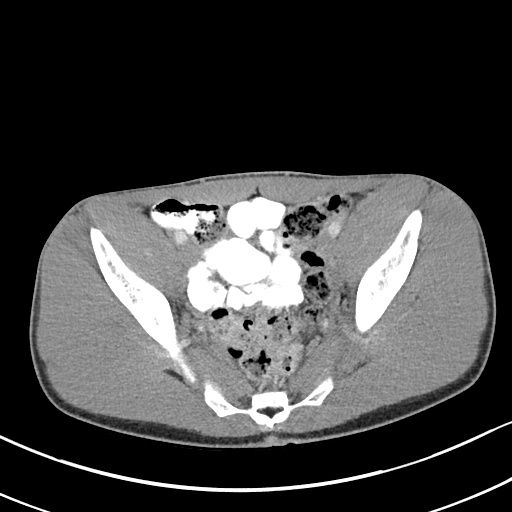
[im 37/89  soft-tissue]
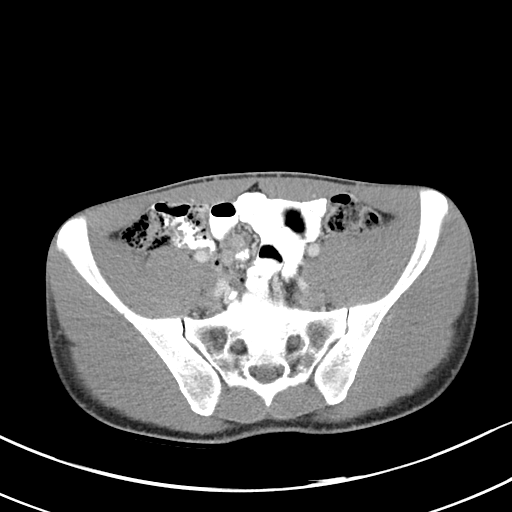
[im 45/89  soft-tissue]
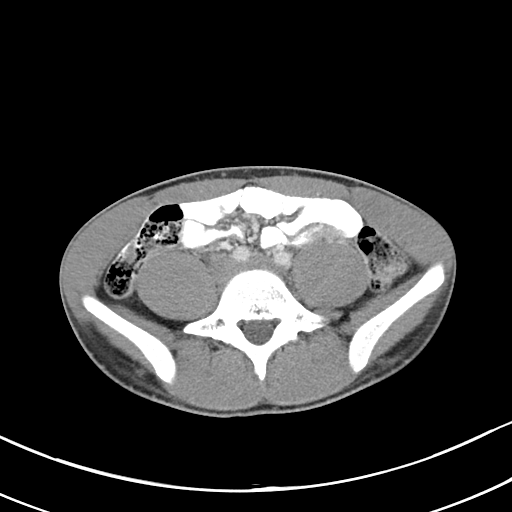
[im 52/89  soft-tissue]
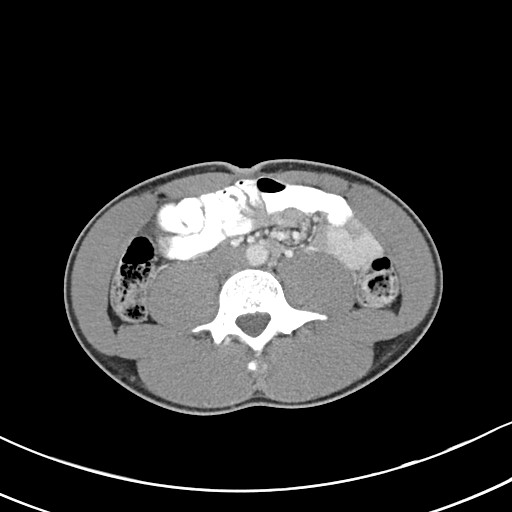
[im 59/89  soft-tissue]
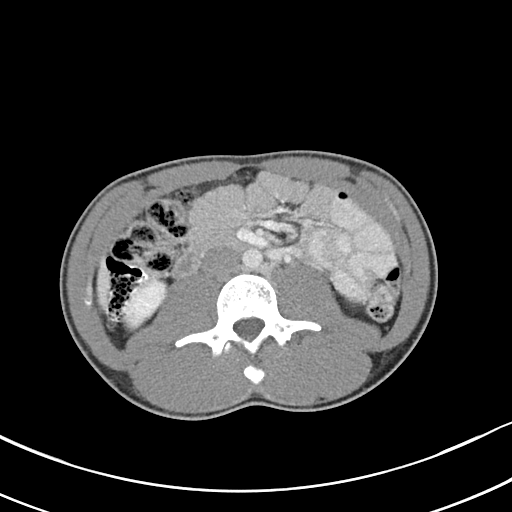
[im 59/89  bone]
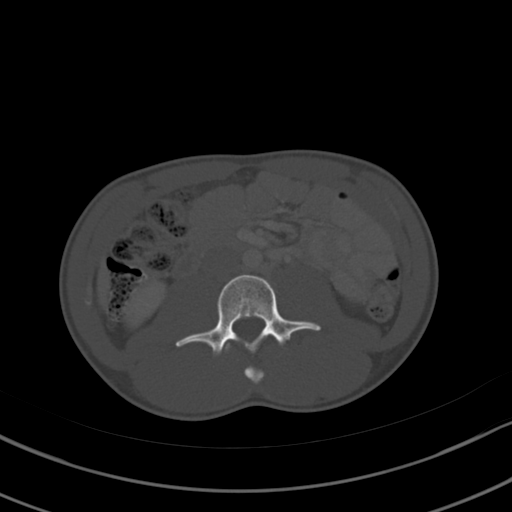
[im 63/89  soft-tissue]
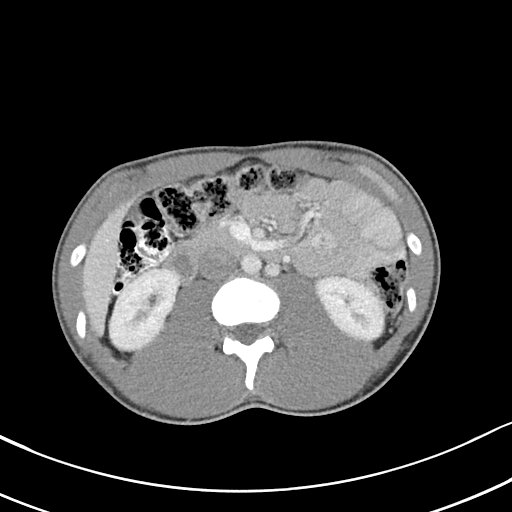
[im 70/89  soft-tissue]
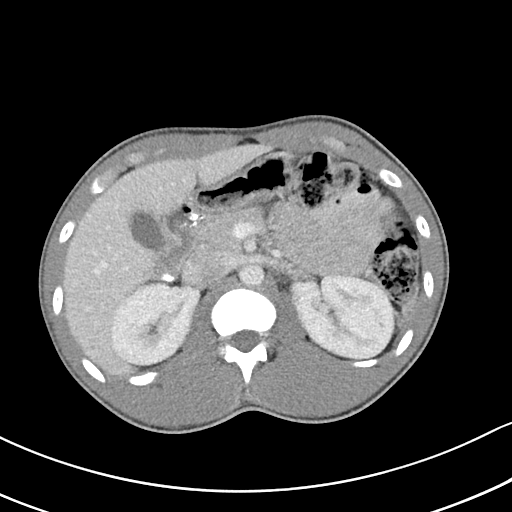
[im 78/89  soft-tissue]
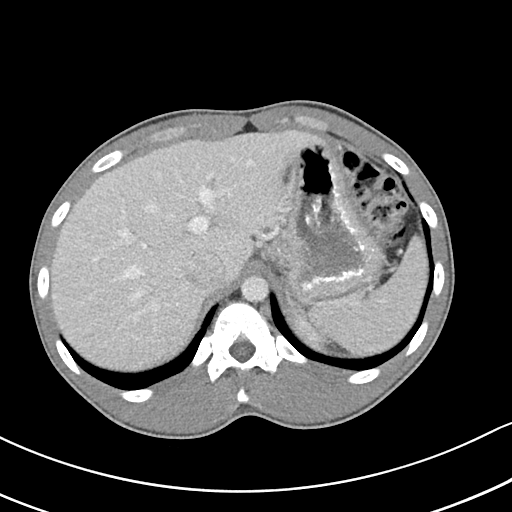
[im 85/89  soft-tissue]
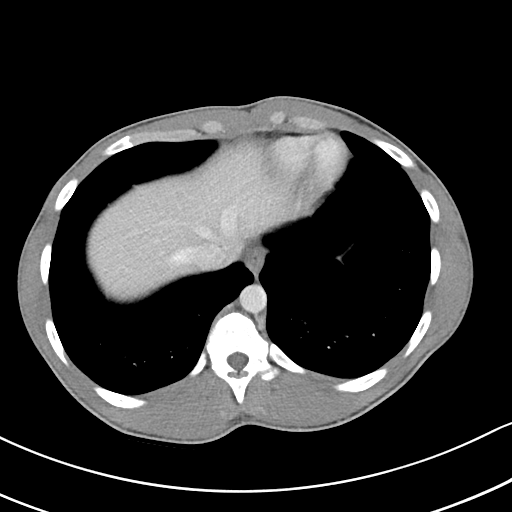

[Series 5: coronal st · coronal · 0.64mm/px · 3 of 78 slices shown]
[im 26/78  soft-tissue]
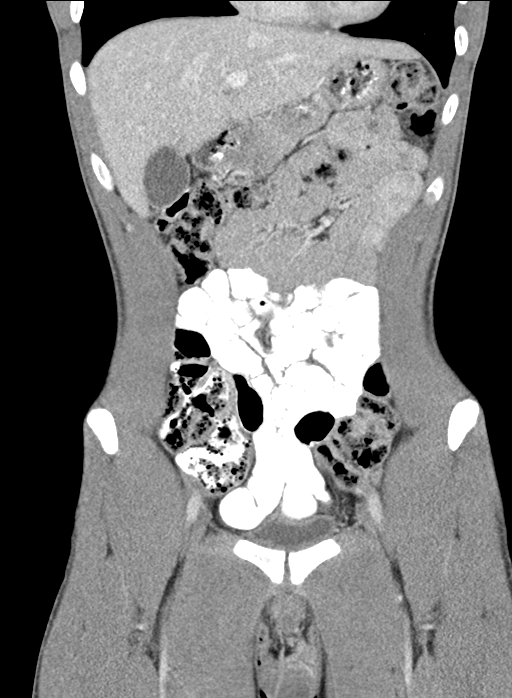
[im 35/78  soft-tissue]
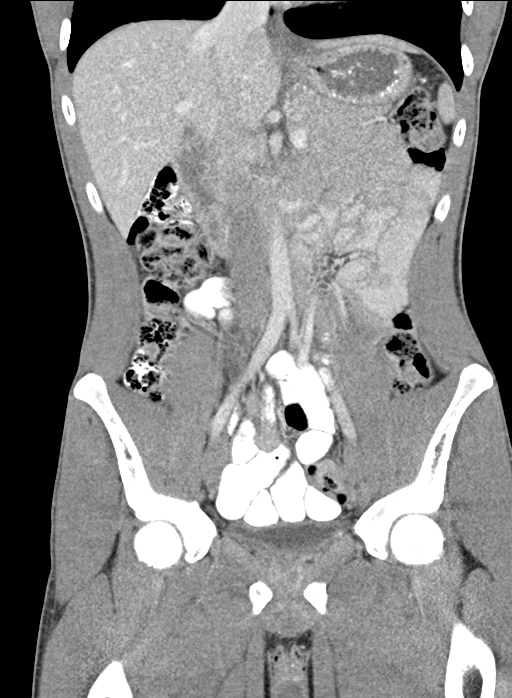
[im 43/78  soft-tissue]
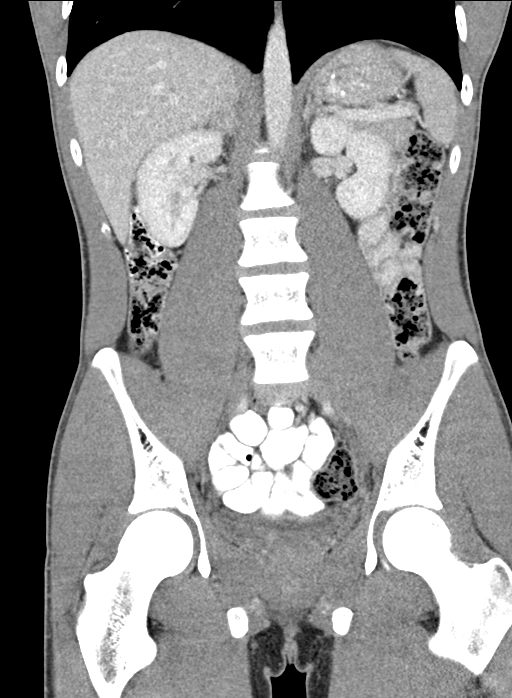

[16 of 46 positions shown; findings below may reference images not displayed]

FINDINGS: Lower Chest: No acute findings.

Hepatobiliary: No hepatic masses identified. Gallbladder is
unremarkable. No evidence of biliary ductal dilatation.

Pancreas:  No mass or inflammatory changes.

Spleen: Within normal limits in size and appearance.

Adrenals/Urinary Tract: No masses identified. No evidence of
ureteral calculi or hydronephrosis.

Stomach/Bowel: No evidence of obstruction, inflammatory process or
abnormal fluid collections. Normal appendix visualized. Moderate to
large colonic stool burden noted.

Vascular/Lymphatic: No pathologically enlarged lymph nodes. No acute
vascular findings.

Reproductive:  No mass or other significant abnormality.

Other:  None.

Musculoskeletal:  No suspicious bone lesions identified.

:
No acute findings within the abdomen or pelvis.

Moderate to large stool burden noted; recommend clinical correlation
for possible constipation.

## 2022-12-01 ENCOUNTER — Ambulatory Visit (INDEPENDENT_AMBULATORY_CARE_PROVIDER_SITE_OTHER): Payer: BC Managed Care – PPO | Admitting: Nurse Practitioner

## 2022-12-01 ENCOUNTER — Encounter: Payer: Self-pay | Admitting: Nurse Practitioner

## 2022-12-01 ENCOUNTER — Telehealth: Payer: BC Managed Care – PPO

## 2022-12-01 VITALS — BP 101/58 | HR 89 | Temp 98.7°F | Ht 70.0 in | Wt 152.8 lb

## 2022-12-01 DIAGNOSIS — J069 Acute upper respiratory infection, unspecified: Secondary | ICD-10-CM | POA: Diagnosis not present

## 2022-12-01 DIAGNOSIS — R051 Acute cough: Secondary | ICD-10-CM

## 2022-12-01 LAB — POCT INFLUENZA A/B
Influenza A, POC: NEGATIVE
Influenza B, POC: NEGATIVE

## 2022-12-01 LAB — POC COVID19 BINAXNOW: SARS Coronavirus 2 Ag: NEGATIVE

## 2022-12-01 MED ORDER — BENZONATATE 100 MG PO CAPS: 100.0000 mg | ORAL_CAPSULE | Freq: Three times a day (TID) | ORAL | 0 refills | Status: DC | PRN

## 2022-12-01 NOTE — Assessment & Plan Note (Signed)
Test negative for COVID and flu Most likely has an upper respiratory viral infection Take Tessalon 100 mg 3 times daily as needed for Tylenol 650 mg every 6 hours as needed for fever Albuterol inhaler 2 puffs every 6 hours as needed for shortness of breath Drink at least 64 ounces of water daily to be

## 2022-12-01 NOTE — Patient Instructions (Addendum)
  Cough, unspecified type  - benzonatate (TESSALON PERLES) 100 MG capsule; Take 1 capsule (100 mg total) by mouth 3 (three) times daily as needed for cough.  Take tylenol 650mg  by mouth every 6 hours as needed for fever.  Albuterol inhaler 2 puffs into the lungs every 6 hours as needed for wheezing , shortness of breath.    It is important that you exercise regularly at least 30 minutes 5 times a week as tolerated  Think about what you will eat, plan ahead. Choose " clean, green, fresh or frozen" over canned, processed or packaged foods which are more sugary, salty and fatty. 70 to 75% of food eaten should be vegetables and fruit. Three meals at set times with snacks allowed between meals, but they must be fruit or vegetables. Aim to eat over a 12 hour period , example 7 am to 7 pm, and STOP after  your last meal of the day. Drink water,generally about 64 ounces per day, no other drink is as healthy. Fruit juice is best enjoyed in a healthy way, by EATING the fruit.  Thanks for choosing [facility] we consider it a privelige to serve you.

## 2022-12-01 NOTE — Progress Notes (Signed)
Acute Office Visit  Subjective:     Patient ID: Gavin Taylor, male    DOB: 2001/03/14, 22 y.o.   MRN: [id number]  Chief Complaint  Patient presents with  . Cough    PT having dried cough, ear pain  , no fiver or chills. Started 11/28/2022.    Cough Associated symptoms include wheezing. Pertinent negatives include no chest pain, chills, ear pain, fever, headaches, hemoptysis, shortness of breath or weight loss.   Gavin Taylor with past medical history of asthma presents with complaints of cough with thick yellowish sputum  ear fullness, some wheezing that started 4 days ago ,.he denies sneezing, stuffiness, sore throat fever, shortness of breath, chest pain, bloody sputum dizziness, nausea, vomiting, malaise.  He is up-to-date with his flu vaccine and COVID-vaccine.  He has been taking Tylenol Cold and flu as needed at home.     Review of Systems  Constitutional: Negative.  Negative for chills, diaphoresis, fever, malaise/fatigue and weight loss.  HENT:  Negative for congestion, ear discharge, ear pain, hearing loss, nosebleeds and tinnitus.   Eyes: Negative.   Respiratory:  Positive for cough, sputum production and wheezing. Negative for hemoptysis and shortness of breath.   Cardiovascular: Negative.  Negative for chest pain, palpitations, orthopnea and claudication.  Musculoskeletal: Negative.   Neurological:  Negative for dizziness, tingling, tremors, sensory change, speech change and headaches.  Psychiatric/Behavioral: Negative.  Negative for depression, hallucinations, substance abuse and suicidal ideas. The patient is not nervous/anxious.         Objective:    BP (!) 101/58   Pulse 89   Temp 98.7 F (37.1 C)   Ht 5' 10"$  (1.778 m)   Wt 152 lb 12.8 oz (69.3 kg)   SpO2 99%   BMI 21.92 kg/m    Physical Exam Constitutional:      General: He is not in acute distress.    Appearance: Normal appearance. He is not ill-appearing, toxic-appearing or diaphoretic.  HENT:     Nose:  No congestion or rhinorrhea.     Mouth/Throat:     Mouth: Mucous membranes are moist.     Pharynx: Oropharynx is clear. No oropharyngeal exudate or posterior oropharyngeal erythema.  Eyes:     General: No scleral icterus.       Right eye: No discharge.        Left eye: No discharge.     Extraocular Movements: Extraocular movements intact.     Conjunctiva/sclera: Conjunctivae normal.  Cardiovascular:     Rate and Rhythm: Normal rate and regular rhythm.     Pulses: Normal pulses.     Heart sounds: Normal heart sounds. No murmur heard.    No friction rub. No gallop.  Pulmonary:     Effort: Pulmonary effort is normal. No respiratory distress.     Breath sounds: Normal breath sounds. No stridor. No wheezing, rhonchi or rales.  Chest:     Chest wall: No tenderness.  Abdominal:     General: There is no distension.     Palpations: Abdomen is soft.     Tenderness: There is no abdominal tenderness. There is no guarding.  Musculoskeletal:        General: No swelling or tenderness. Normal range of motion.     Right lower leg: No edema.     Left lower leg: No edema.  Neurological:     Mental Status: He is alert and oriented to person, place, and time.     Cranial Nerves: No  cranial nerve deficit.     Sensory: No sensory deficit.     Motor: No weakness.     Coordination: Coordination normal.     Gait: Gait normal.  Psychiatric:        Mood and Affect: Mood normal.        Behavior: Behavior normal.        Thought Content: Thought content normal.        Judgment: Judgment normal.    Results for orders placed or performed in visit on 12/01/22  POCT Influenza A/B  Result Value Ref Range   Influenza A, POC Negative Negative   Influenza B, POC Negative Negative  POC COVID-19 BinaxNow  Result Value Ref Range   SARS Coronavirus 2 Ag Negative Negative        Assessment & Plan:   Problem List Items Addressed This Visit       Respiratory   Upper respiratory infection - Primary     Test negative for COVID and flu Most likely has an upper respiratory viral infection Take Tessalon 100 mg 3 times daily as needed for Tylenol 650 mg every 6 hours as needed for fever Albuterol inhaler 2 puffs every 6 hours as needed for shortness of breath Drink at least 64 ounces of water daily to be      Relevant Medications   benzonatate (TESSALON PERLES) 100 MG capsule    Meds ordered this encounter  Medications  . benzonatate (TESSALON PERLES) 100 MG capsule    Sig: Take 1 capsule (100 mg total) by mouth 3 (three) times daily as needed for cough.    Dispense:  15 capsule    Refill:  0    Return in about 3 months (around 03/01/2023) for CPE.  Renee Rival, FNP

## 2022-12-04 ENCOUNTER — Telehealth: Payer: BC Managed Care – PPO | Admitting: Physician Assistant

## 2022-12-04 DIAGNOSIS — J069 Acute upper respiratory infection, unspecified: Secondary | ICD-10-CM | POA: Diagnosis not present

## 2022-12-04 DIAGNOSIS — B9689 Other specified bacterial agents as the cause of diseases classified elsewhere: Secondary | ICD-10-CM

## 2022-12-04 MED ORDER — PREDNISONE 20 MG PO TABS: 40.0000 mg | ORAL_TABLET | Freq: Every day | ORAL | 0 refills | Status: DC

## 2022-12-04 MED ORDER — PROMETHAZINE-DM 6.25-15 MG/5ML PO SYRP: 5.0000 mL | ORAL_SOLUTION | Freq: Four times a day (QID) | ORAL | 0 refills | Status: DC | PRN

## 2022-12-04 MED ORDER — DOXYCYCLINE HYCLATE 100 MG PO TABS: 100.0000 mg | ORAL_TABLET | Freq: Two times a day (BID) | ORAL | 0 refills | Status: DC

## 2022-12-04 NOTE — Patient Instructions (Signed)
Gavin Taylor, thank you for joining Mar Daring, PA-C for today's virtual visit.  While this provider is not your primary care provider (PCP), if your PCP is located in our provider database this encounter information will be shared with them immediately following your visit.   [facility] account gives you access to today's visit and all your visits, tests, and labs performed at [facility] " click here if you don't have a [facility] account or go to [email or web address]  Consent: (Patient) Gavin Taylor provided verbal consent for this virtual visit at the beginning of the encounter.  Current Medications:  Current Outpatient Medications:    albuterol (VENTOLIN HFA) 108 (90 Base) MCG/ACT inhaler, Inhale 2 puffs into the lungs every 6 (six) hours as needed for wheezing or shortness of breath., Disp: 8 g, Rfl: 11   benzonatate (TESSALON PERLES) 100 MG capsule, Take 1 capsule (100 mg total) by mouth 3 (three) times daily as needed for cough., Disp: 15 capsule, Rfl: 0   diclofenac (VOLTAREN) 75 MG EC tablet, Take 75 mg by mouth as needed. (Patient not taking: Reported on 04/14/2022), Disp: , Rfl:    doxycycline (VIBRA-TABS) 100 MG tablet, Take 1 tablet (100 mg total) by mouth 2 (two) times daily., Disp: 20 tablet, Rfl: 0   glycopyrrolate (ROBINUL) 2 MG tablet, Take 1 tablet (2 mg total) by mouth 2 (two) times daily as needed. (Patient not taking: Reported on 04/14/2022), Disp: 60 tablet, Rfl: 1   ibuprofen (ADVIL) 800 MG tablet, Take 1 tablet (800 mg total) by mouth every 8 (eight) hours as needed., Disp: 30 tablet, Rfl: 3   predniSONE (DELTASONE) 20 MG tablet, Take 2 tablets (40 mg total) by mouth daily with breakfast., Disp: 10 tablet, Rfl: 0   promethazine-dextromethorphan (PROMETHAZINE-DM) 6.25-15 MG/5ML syrup, Take 5 mLs by mouth 4 (four) times daily as needed., Disp: 118 mL, Rfl: 0   Medications ordered in this encounter:  Meds ordered this encounter   Medications   DISCONTD: doxycycline (VIBRA-TABS) 100 MG tablet    Sig: Take 1 tablet (100 mg total) by mouth 2 (two) times daily.    Dispense:  20 tablet    Refill:  0    Order Specific Question:   Supervising Provider    Answer:   Chase Picket [id number]   DISCONTD: predniSONE (DELTASONE) 20 MG tablet    Sig: Take 2 tablets (40 mg total) by mouth daily with breakfast.    Dispense:  10 tablet    Refill:  0    Order Specific Question:   Supervising Provider    Answer:   Chase Picket [id number]   DISCONTD: promethazine-dextromethorphan (PROMETHAZINE-DM) 6.25-15 MG/5ML syrup    Sig: Take 5 mLs by mouth 4 (four) times daily as needed.    Dispense:  118 mL    Refill:  0    Order Specific Question:   Supervising Provider    Answer:   Chase Picket [id number]   DISCONTD: doxycycline (VIBRA-TABS) 100 MG tablet    Sig: Take 1 tablet (100 mg total) by mouth 2 (two) times daily.    Dispense:  20 tablet    Refill:  0    Order Specific Question:   Supervising Provider    Answer:   Chase Picket [id number]   DISCONTD: predniSONE (DELTASONE) 20 MG tablet    Sig: Take 2 tablets (40 mg total) by mouth daily with breakfast.    Dispense:  10 tablet  Refill:  0    Order Specific Question:   Supervising Provider    Answer:   Chase Picket [id number]   DISCONTD: promethazine-dextromethorphan (PROMETHAZINE-DM) 6.25-15 MG/5ML syrup    Sig: Take 5 mLs by mouth 4 (four) times daily as needed.    Dispense:  118 mL    Refill:  0    Order Specific Question:   Supervising Provider    Answer:   Chase Picket [id number]   doxycycline (VIBRA-TABS) 100 MG tablet    Sig: Take 1 tablet (100 mg total) by mouth 2 (two) times daily.    Dispense:  20 tablet    Refill:  0    Order Specific Question:   Supervising Provider    Answer:   Chase Picket [id number]   predniSONE (DELTASONE) 20 MG tablet    Sig: Take 2 tablets (40 mg total) by mouth daily with breakfast.    Dispense:  10  tablet    Refill:  0    Order Specific Question:   Supervising Provider    Answer:   Chase Picket [id number]   promethazine-dextromethorphan (PROMETHAZINE-DM) 6.25-15 MG/5ML syrup    Sig: Take 5 mLs by mouth 4 (four) times daily as needed.    Dispense:  118 mL    Refill:  0    Order Specific Question:   Supervising Provider    Answer:   Chase Picket [id number]     *If you need refills on other medications prior to your next appointment, please contact your pharmacy*  Follow-Up: Call back or seek an in-person evaluation if the symptoms worsen or if the condition fails to improve as anticipated.  [place] [phone]  Other Instructions  Upper Respiratory Infection, Adult An upper respiratory infection (URI) is a common viral infection of the nose, throat, and upper air passages that lead to the lungs. The most common type of URI is the common cold. URIs usually get better on their own, without medical treatment. What are the causes? A URI is caused by a virus. You may catch a virus by: Breathing in droplets from an infected person's cough or sneeze. Touching something that has been exposed to the virus (is contaminated) and then touching your mouth, nose, or eyes. What increases the risk? You are more likely to get a URI if: You are very young or very old. You have close contact with others, such as at work, school, or a health care facility. You smoke. You have long-term (chronic) heart or lung disease. You have a weakened disease-fighting system (immune system). You have nasal allergies or asthma. You are experiencing a lot of stress. You have poor nutrition. What are the signs or symptoms? A URI usually involves some of the following symptoms: Runny or stuffy (congested) nose. Cough. Sneezing. Sore throat. Headache. Fatigue. Fever. Loss of appetite. Pain in your forehead, behind your eyes, and over your cheekbones (sinus pain). Muscle  aches. Redness or irritation of the eyes. Pressure in the ears or face. How is this diagnosed? This condition may be diagnosed based on your medical history and symptoms, and a physical exam. Your health care provider may use a swab to take a mucus sample from your nose (nasal swab). This sample can be tested to determine what virus is causing the illness. How is this treated? URIs usually get better on their own within 7-10 days. Medicines cannot cure URIs, but your health care provider may recommend certain medicines to  help relieve symptoms, such as: Over-the-counter cold medicines. Cough suppressants. Coughing is a type of defense against infection that helps to clear the respiratory system, so take these medicines only as recommended by your health care provider. Fever-reducing medicines. Follow these instructions at home: Activity Rest as needed. If you have a fever, stay home from work or school until your fever is gone or until your health care provider says your URI cannot spread to other people (is no longer contagious). Your health care provider may have you wear a face mask to prevent your infection from spreading. Relieving symptoms Gargle with a mixture of salt and water 3-4 times a day or as needed. To make salt water, completely dissolve -1 tsp (3-6 g) of salt in 1 cup (237 mL) of warm water. Use a cool-mist humidifier to add moisture to the air. This can help you breathe more easily. Eating and drinking  Drink enough fluid to keep your urine pale yellow. Eat soups and other clear broths. General instructions  Take over-the-counter and prescription medicines only as told by your health care provider. These include cold medicines, fever reducers, and cough suppressants. Do not use any products that contain nicotine or tobacco. These products include cigarettes, chewing tobacco, and vaping devices, such as e-cigarettes. If you need help quitting, ask your health care  provider. Stay away from secondhand smoke. Stay up to date on all immunizations, including the yearly (annual) flu vaccine. Keep all follow-up visits. This is important. How to prevent the spread of infection to others URIs can be contagious. To prevent the infection from spreading: Wash your hands with soap and water for at least 20 seconds. If soap and water are not available, use hand sanitizer. Avoid touching your mouth, face, eyes, or nose. Cough or sneeze into a tissue or your sleeve or elbow instead of into your hand or into the air.  Contact a health care provider if: You are getting worse instead of better. You have a fever or chills. Your mucus is brown or red. You have yellow or brown discharge coming from your nose. You have pain in your face, especially when you bend forward. You have swollen neck glands. You have pain while swallowing. You have white areas in the back of your throat. Get help right away if: You have shortness of breath that gets worse. You have severe or persistent: Headache. Ear pain. Sinus pain. Chest pain. You have chronic lung disease along with any of the following: Making high-pitched whistling sounds when you breathe, most often when you breathe out (wheezing). Prolonged cough (more than 14 days). Coughing up blood. A change in your usual mucus. You have a stiff neck. You have changes in your: Vision. Hearing. Thinking. Mood. These symptoms may be an emergency. Get help right away. Call 911. Do not wait to see if the symptoms will go away. Do not drive yourself to the hospital. Summary An upper respiratory infection (URI) is a common infection of the nose, throat, and upper air passages that lead to the lungs. A URI is caused by a virus. URIs usually get better on their own within 7-10 days. Medicines cannot cure URIs, but your health care provider may recommend certain medicines to help relieve symptoms. This information is not  intended to replace advice given to you by your health care provider. Make sure you discuss any questions you have with your health care provider. Document Revised: 05/11/2021 Document Reviewed: 05/11/2021 Elsevier Patient Education  [employer].  If you have been instructed to have an in-person evaluation today at a local Urgent Care facility, please use the link below. It will take you to a list of all of our available [facility], including address, phone number and hours of operation. Please do not delay care.  [facility]  If you or a family member do not have a primary care provider, use the link below to schedule a visit and establish care. When you choose a [facility] primary care physician or advanced practice provider, you gain a long-term partner in health. Find a Primary Care Provider  Learn more about [facility] in-office and virtual care options: [facility]

## 2022-12-04 NOTE — Progress Notes (Signed)
Virtual Visit Consent   Gavin Taylor, you are scheduled for a virtual visit with a [facility] provider today. Just as with appointments in the office, your consent must be obtained to participate. Your consent will be active for this visit and any virtual visit you may have with one of our providers in the next 365 days. If you have a MyChart account, a copy of this consent can be sent to you electronically.  As this is a virtual visit, video technology does not allow for your provider to perform a traditional examination. This may limit your provider's ability to fully assess your condition. If your provider identifies any concerns that need to be evaluated in person or the need to arrange testing (such as labs, EKG, etc.), we will make arrangements to do so. Although advances in technology are sophisticated, we cannot ensure that it will always work on either your end or our end. If the connection with a video visit is poor, the visit may have to be switched to a telephone visit. With either a video or telephone visit, we are not always able to ensure that we have a secure connection.  By engaging in this virtual visit, you consent to the provision of healthcare and authorize for your insurance to be billed (if applicable) for the services provided during this visit. Depending on your insurance coverage, you may receive a charge related to this service.  I need to obtain your verbal consent now. Are you willing to proceed with your visit today? Gavin Taylor has provided verbal consent on 12/04/2022 for a virtual visit (video or telephone). Mar Daring, PA-C  Date: 12/04/2022 6:38 PM  Virtual Visit via Video Note   I, Mar Daring, connected with  Gavin Taylor  ([id number], [birth year]) on 12/04/22 at  6:30 PM EST by a video-enabled telemedicine application and verified that I am speaking with the correct person using two identifiers.  Location: Patient: Virtual Visit Location Patient:  Home Provider: Virtual Visit Location Provider: Home Office   I discussed the limitations of evaluation and management by telemedicine and the availability of in person appointments. The patient expressed understanding and agreed to proceed.    History of Present Illness: Gavin Taylor is a 22 y.o. who identifies as a male who was assigned male at birth, and is being seen today for cough.  HPI: Cough This is a new problem. The current episode started in the past 7 days (Symptoms started 11/28/22; seen in person UC on 12/01/22, tested negative for flu and Covid; diagnosed Viral URI and prescribed tessalon perles and albuterol inhaler). The problem has been gradually worsening. The problem occurs constantly. The cough is Productive of sputum and productive of purulent sputum. Associated symptoms include chills, ear congestion, myalgias, nasal congestion, postnasal drip, a sore throat (from drainage and coughing), shortness of breath and wheezing. Pertinent negatives include no ear pain, fever, headaches or rhinorrhea. Associated symptoms comments: Hoarse voice. The symptoms are aggravated by lying down. He has tried a beta-agonist inhaler, prescription cough suppressant and OTC cough suppressant (tylenol sinus) for the symptoms. The treatment provided no relief. His past medical history is significant for asthma and bronchitis. There is no history of environmental allergies or pneumonia.      Problems:  Patient Active Problem List   Diagnosis Date Noted   Upper respiratory infection 12/01/2022   Healthcare maintenance 04/14/2022    Allergies:  Allergies  Allergen Reactions   Cefdinir     Other reaction(s):  Rash   Medications:  Current Outpatient Medications:    albuterol (VENTOLIN HFA) 108 (90 Base) MCG/ACT inhaler, Inhale 2 puffs into the lungs every 6 (six) hours as needed for wheezing or shortness of breath., Disp: 8 g, Rfl: 11   benzonatate (TESSALON PERLES) 100 MG capsule, Take 1 capsule  (100 mg total) by mouth 3 (three) times daily as needed for cough., Disp: 15 capsule, Rfl: 0   diclofenac (VOLTAREN) 75 MG EC tablet, Take 75 mg by mouth as needed. (Patient not taking: Reported on 04/14/2022), Disp: , Rfl:    doxycycline (VIBRA-TABS) 100 MG tablet, Take 1 tablet (100 mg total) by mouth 2 (two) times daily., Disp: 20 tablet, Rfl: 0   glycopyrrolate (ROBINUL) 2 MG tablet, Take 1 tablet (2 mg total) by mouth 2 (two) times daily as needed. (Patient not taking: Reported on 04/14/2022), Disp: 60 tablet, Rfl: 1   ibuprofen (ADVIL) 800 MG tablet, Take 1 tablet (800 mg total) by mouth every 8 (eight) hours as needed., Disp: 30 tablet, Rfl: 3   predniSONE (DELTASONE) 20 MG tablet, Take 2 tablets (40 mg total) by mouth daily with breakfast., Disp: 10 tablet, Rfl: 0   promethazine-dextromethorphan (PROMETHAZINE-DM) 6.25-15 MG/5ML syrup, Take 5 mLs by mouth 4 (four) times daily as needed., Disp: 118 mL, Rfl: 0  Observations/Objective: Patient is well-developed, well-nourished in no acute distress.  Resting comfortably at home.  Head is normocephalic, atraumatic.  No labored breathing. Speech is clear and coherent with logical content.  Patient is alert and oriented at baseline.    Assessment and Plan: 1. Bacterial upper respiratory infection - doxycycline (VIBRA-TABS) 100 MG tablet; Take 1 tablet (100 mg total) by mouth 2 (two) times daily.  Dispense: 20 tablet; Refill: 0 - predniSONE (DELTASONE) 20 MG tablet; Take 2 tablets (40 mg total) by mouth daily with breakfast.  Dispense: 10 tablet; Refill: 0 - promethazine-dextromethorphan (PROMETHAZINE-DM) 6.25-15 MG/5ML syrup; Take 5 mLs by mouth 4 (four) times daily as needed.  Dispense: 118 mL; Refill: 0  - Worsening symptoms that have not responded to OTC medications.  - Will give Doxycycline, Promethazine DM and Prednisone - Continue allergy medications.  - Continue Tessalon perles and Albuterol as prescribed - Steam and humidifier can  help - Stay well hydrated and get plenty of rest.  - Seek in person evaluation if no symptom improvement or if symptoms worsen   Follow Up Instructions: I discussed the assessment and treatment plan with the patient. The patient was provided an opportunity to ask questions and all were answered. The patient agreed with the plan and demonstrated an understanding of the instructions.  A copy of instructions were sent to the patient via MyChart unless otherwise noted below.    The patient was advised to call back or seek an in-person evaluation if the symptoms worsen or if the condition fails to improve as anticipated.  Time:  I spent 12 minutes with the patient via telehealth technology discussing the above problems/concerns.    Mar Daring, PA-C

## 2023-03-02 ENCOUNTER — Ambulatory Visit (INDEPENDENT_AMBULATORY_CARE_PROVIDER_SITE_OTHER): Payer: BC Managed Care – PPO | Admitting: Nurse Practitioner

## 2023-03-02 ENCOUNTER — Encounter: Payer: Self-pay | Admitting: Nurse Practitioner

## 2023-03-02 VITALS — BP 100/63 | HR 62 | Temp 97.2°F | Wt 149.8 lb

## 2023-03-02 DIAGNOSIS — Z0000 Encounter for general adult medical examination without abnormal findings: Secondary | ICD-10-CM

## 2023-03-05 NOTE — Progress Notes (Signed)
Patient not seen.

## 2023-10-09 ENCOUNTER — Ambulatory Visit (INDEPENDENT_AMBULATORY_CARE_PROVIDER_SITE_OTHER): Payer: BC Managed Care – PPO | Admitting: Nurse Practitioner

## 2023-10-09 ENCOUNTER — Encounter: Payer: Self-pay | Admitting: Nurse Practitioner

## 2023-10-09 VITALS — BP 113/60 | HR 76 | Temp 97.6°F | Wt 150.0 lb

## 2023-10-09 DIAGNOSIS — Z113 Encounter for screening for infections with a predominantly sexual mode of transmission: Secondary | ICD-10-CM | POA: Insufficient documentation

## 2023-10-09 DIAGNOSIS — Z1329 Encounter for screening for other suspected endocrine disorder: Secondary | ICD-10-CM

## 2023-10-09 DIAGNOSIS — Z13228 Encounter for screening for other metabolic disorders: Secondary | ICD-10-CM

## 2023-10-09 DIAGNOSIS — Z130 Encounter for screening for diseases of the blood and blood-forming organs and certain disorders involving the immune mechanism: Secondary | ICD-10-CM | POA: Diagnosis not present

## 2023-10-09 DIAGNOSIS — Z1321 Encounter for screening for nutritional disorder: Secondary | ICD-10-CM

## 2023-10-09 DIAGNOSIS — Z0000 Encounter for general adult medical examination without abnormal findings: Secondary | ICD-10-CM | POA: Insufficient documentation

## 2023-10-09 DIAGNOSIS — G8929 Other chronic pain: Secondary | ICD-10-CM | POA: Insufficient documentation

## 2023-10-09 DIAGNOSIS — R109 Unspecified abdominal pain: Secondary | ICD-10-CM

## 2023-10-09 DIAGNOSIS — Z23 Encounter for immunization: Secondary | ICD-10-CM | POA: Insufficient documentation

## 2023-10-09 NOTE — Patient Instructions (Signed)

## 2023-10-09 NOTE — Assessment & Plan Note (Signed)
Currently has 1 sexual partner, patient encouraged to maintain 1 sexual partner Is interested in screening for STD, he denies any symptoms  - HepB+HepC+HIV Panel - Chlamydia/GC NAA, Confirmation

## 2023-10-09 NOTE — Progress Notes (Signed)
Complete physical exam  Patient: Gavin Taylor   DOB: 05-15-2001   22 y.o. Male  MRN: [id number]  Subjective:    No chief complaint on file.   Rupert Baumhardt is a 22 y.o. male  has a past medical history of Asthma, Diverticulitis, and Frequent headaches. who presents today for a complete physical exam. He reports consuming a general diet.  Goes to the gym every other day for exercise at He generally feels well. He reports sleeping fairly well.   Flu vaccine and Tdap vaccine given in the office today.  Encouraged to get the HPV vaccine if not up-to-date    Most recent fall risk assessment:    04/14/2022   10:03 AM  Fall Risk   Falls in the past year? 0  Number falls in past yr: 0  Injury with Fall? 0  Risk for fall due to : No Fall Risks  Follow up Falls evaluation completed     Most recent depression screenings:    03/02/2023   10:25 AM 04/14/2022   10:03 AM  PHQ 2/9 Scores  PHQ - 2 Score 0 0        Patient Care Team: Donell Beers, FNP as PCP - General (Nurse Practitioner)   Outpatient Medications Prior to Visit  Medication Sig   albuterol (VENTOLIN HFA) 108 (90 Base) MCG/ACT inhaler Inhale 2 puffs into the lungs every 6 (six) hours as needed for wheezing or shortness of breath.   Ascorbic Acid (VITAMIN C PO) Take by mouth.   Cyanocobalamin (VITAMIN B 12 PO) Take by mouth.   Multiple Vitamins-Minerals (ZINC PO) Take by mouth.   VITAMIN A PO Take by mouth.   VITAMIN D PO Take by mouth.   VITAMIN E PO Take by mouth.   diclofenac (VOLTAREN) 75 MG EC tablet Take 75 mg by mouth as needed. (Patient not taking: Reported on 10/09/2023)   [DISCONTINUED] benzonatate (TESSALON PERLES) 100 MG capsule Take 1 capsule (100 mg total) by mouth 3 (three) times daily as needed for cough. (Patient not taking: Reported on 10/09/2023)   [DISCONTINUED] doxycycline (VIBRA-TABS) 100 MG tablet Take 1 tablet (100 mg total) by mouth 2 (two) times daily. (Patient not taking: Reported on  10/09/2023)   [DISCONTINUED] glycopyrrolate (ROBINUL) 2 MG tablet Take 1 tablet (2 mg total) by mouth 2 (two) times daily as needed. (Patient not taking: Reported on 10/09/2023)   [DISCONTINUED] ibuprofen (ADVIL) 800 MG tablet Take 1 tablet (800 mg total) by mouth every 8 (eight) hours as needed. (Patient not taking: Reported on 10/09/2023)   [DISCONTINUED] predniSONE (DELTASONE) 20 MG tablet Take 2 tablets (40 mg total) by mouth daily with breakfast. (Patient not taking: Reported on 10/09/2023)   [DISCONTINUED] promethazine-dextromethorphan (PROMETHAZINE-DM) 6.25-15 MG/5ML syrup Take 5 mLs by mouth 4 (four) times daily as needed. (Patient not taking: Reported on 10/09/2023)   No facility-administered medications prior to visit.    Review of Systems  Constitutional:  Negative for appetite change, chills, fatigue and fever.  HENT:  Negative for congestion, postnasal drip, rhinorrhea and sneezing.   Eyes:  Negative for pain, discharge and itching.  Respiratory:  Negative for cough, shortness of breath and wheezing.   Cardiovascular:  Negative for chest pain, palpitations and leg swelling.  Gastrointestinal:  Positive for abdominal pain. Negative for constipation, nausea and vomiting.       Chronic intermittent abdominal pain  Endocrine: Negative for cold intolerance, heat intolerance and polydipsia.  Genitourinary:  Negative for difficulty urinating, dysuria,  flank pain and frequency.  Musculoskeletal:  Negative for arthralgias, back pain, joint swelling and myalgias.  Skin:  Negative for color change, pallor, rash and wound.  Neurological:  Negative for dizziness, facial asymmetry, weakness, numbness and headaches.  Psychiatric/Behavioral:  Negative for behavioral problems, confusion, self-injury and suicidal ideas.        Objective:     BP 113/60   Pulse 76   Temp 97.6 F (36.4 C)   Wt 150 lb (68 kg)   SpO2 99%   BMI 21.52 kg/m    Physical Exam Vitals and nursing note  reviewed. Exam conducted with a chaperone present.  Constitutional:      General: He is not in acute distress.    Appearance: Normal appearance. He is not ill-appearing, toxic-appearing or diaphoretic.  HENT:     Right Ear: Tympanic membrane, ear canal and external ear normal. There is no impacted cerumen.     Left Ear: Tympanic membrane, ear canal and external ear normal. There is no impacted cerumen.     Nose: Nose normal. No congestion or rhinorrhea.     Mouth/Throat:     Mouth: Mucous membranes are moist.     Pharynx: Oropharynx is clear. No oropharyngeal exudate or posterior oropharyngeal erythema.  Eyes:     General: No scleral icterus.       Right eye: No discharge.        Left eye: No discharge.     Extraocular Movements: Extraocular movements intact.     Conjunctiva/sclera: Conjunctivae normal.  Neck:     Vascular: No carotid bruit.  Cardiovascular:     Rate and Rhythm: Normal rate and regular rhythm.     Pulses: Normal pulses.     Heart sounds: Normal heart sounds. No murmur heard.    No friction rub. No gallop.  Pulmonary:     Effort: Pulmonary effort is normal. No respiratory distress.     Breath sounds: Normal breath sounds. No stridor. No wheezing, rhonchi or rales.  Chest:     Chest wall: No tenderness.  Abdominal:     General: Bowel sounds are normal. There is no distension.     Palpations: Abdomen is soft. There is no mass.     Tenderness: There is abdominal tenderness. There is no right CVA tenderness, left CVA tenderness or guarding.     Hernia: No hernia is present.     Comments: Mild tenderness on palpation of lower abdominal quadrants  Musculoskeletal:        General: No swelling, tenderness, deformity or signs of injury.     Cervical back: Normal range of motion and neck supple. No rigidity or tenderness.     Right lower leg: No edema.     Left lower leg: No edema.  Lymphadenopathy:     Cervical: No cervical adenopathy.  Skin:    General: Skin is warm  and dry.     Capillary Refill: Capillary refill takes less than 2 seconds.     Coloration: Skin is not jaundiced or pale.     Findings: No bruising, erythema, lesion or rash.  Neurological:     Mental Status: He is alert and oriented to person, place, and time.     Cranial Nerves: No cranial nerve deficit.     Sensory: No sensory deficit.     Motor: No weakness.     Coordination: Coordination normal.     Gait: Gait normal.     Deep Tendon Reflexes: Reflexes normal.  Psychiatric:        Mood and Affect: Mood normal.        Behavior: Behavior normal.        Thought Content: Thought content normal.        Judgment: Judgment normal.     No results found for any visits on 10/09/23.     Assessment & Plan:    Routine Health Maintenance and Physical Exam  Immunization History  Administered Date(s) Administered   DTP 03/29/2009, 05/28/2009   DTaP 09/02/2009, 06/08/2010   HIB, Unspecified 03/29/2009, 05/28/2009, 07/02/2009, 06/08/2010   Hepatitis B, PED/ADOLESCENT 02/25/2009, 03/29/2009, 08/29/2010   IPV 03/29/2009, 05/28/2009, 08/29/2010   Influenza, Seasonal, Injecte, Preservative Fre 10/09/2023   Influenza,inj,quad, With Preservative 09/02/2009, 10/05/2009, 08/29/2010   Influenza-Unspecified 12/07/2014   MMR 03/31/2010   Moderna Sars-Covid-2 Vaccination 01/08/2020, 02/10/2020   Pneumococcal Conjugate,unspecified 05/28/2009, 07/02/2009, 09/02/2009, 03/31/2010   Rotavirus Monovalent 07/02/2009, 09/02/2009   Tdap 07/15/2012, 10/09/2023   Unspecified SARS-COV-2 Vaccination 04/06/2021   Varicella 03/31/2010    Health Maintenance  Topic Date Due   HPV VACCINES (1 - Male 3-dose series) Never done   Hepatitis C Screening  Never done   COVID-19 Vaccine (4 - 2024-25 season) 06/24/2023   DTaP/Tdap/Td (6 - Td or Tdap) 10/08/2033   INFLUENZA VACCINE  Completed   HIV Screening  Completed    Discussed health benefits of physical activity, and encouraged him to engage in regular  exercise appropriate for his age and condition.  Problem List Items Addressed This Visit       Other   Need for Tdap vaccination   Patient educated on CDC recommendation for the vaccine. Verbal consent was obtained from the patient, vaccine administered by nurse, no sign of adverse reactions noted at this time. Patient education on arm soreness and use of tylenol for this patient  was discussed. Patient educated on the signs and symptoms of adverse effect and advise to contact the office if they occur.         Relevant Orders   Tdap vaccine greater than or equal to 7yo IM (Completed)   Need for influenza vaccination   Patient educated on CDC recommendation for the vaccine. Verbal consent was obtained from the patient, vaccine administered by nurse, no sign of adverse reactions noted at this time. Patient education on arm soreness and use of tylenol for this patient  was discussed. Patient educated on the signs and symptoms of adverse effect and advise to contact the office if they occur. Vaccine information sheet given to patient.       Relevant Orders   Flu vaccine trivalent PF, 6mos and older(Flulaval,Afluria,Fluarix,Fluzone) (Completed)   Annual physical exam - Primary   Annual exam as documented.  Counseling done include healthy lifestyle involving committing to 150 minutes of exercise per week, heart healthy diet, and attaining healthy weight. The importance of adequate sleep also discussed.  Regular use of seat belt and home safety were also discussed . Changes in health habits are decided on by patient with goals and time frames set for achieving them. Immunization and cancer screening  needs are specifically addressed at this visit.    1. Need for influenza vaccination  - Flu vaccine trivalent PF, 6mos and older(Flulaval,Afluria,Fluarix,Fluzone)  2. Need for Tdap vaccination  - Tdap vaccine greater than or equal to 7yo IM  3. Screening for endocrine, nutritional, metabolic and  immunity disorder  - CBC - CMP14+EGFR - Lipid panel  Chronic abdominal pain   Stated that he has been avoiding certain foods that triggers abdominal pain Had a colonoscopy in the past which he stated was normal      Screening examination for STI   Currently has 1 sexual partner, patient encouraged to maintain 1 sexual partner Is interested in screening for STD, he denies any symptoms  - HepB+HepC+HIV Panel - Chlamydia/GC NAA, Confirmation        Relevant Orders   HepB+HepC+HIV Panel   Chlamydia/GC NAA, Confirmation   Other Visit Diagnoses       Screening for endocrine, nutritional, metabolic and immunity disorder       Relevant Orders   CBC   CMP14+EGFR   Lipid panel      Return in about 1 year (around 10/08/2024) for CPE.     Donell Beers, FNP

## 2023-10-09 NOTE — Assessment & Plan Note (Signed)
Stated that he has been avoiding certain foods that triggers abdominal pain Had a colonoscopy in the past which he stated was normal

## 2023-10-09 NOTE — Assessment & Plan Note (Addendum)
Patient educated on CDC recommendation for the vaccine. Verbal consent was obtained from the patient, vaccine administered by nurse, no sign of adverse reactions noted at this time. Patient education on arm soreness and use of tylenol  for this patient  was discussed. Patient educated on the signs and symptoms of adverse effect and advise to contact the office if they occur.  ?

## 2023-10-09 NOTE — Assessment & Plan Note (Signed)

## 2023-10-09 NOTE — Assessment & Plan Note (Signed)
Annual exam as documented.  Counseling done include healthy lifestyle involving committing to 150 minutes of exercise per week, heart healthy diet, and attaining healthy weight. The importance of adequate sleep also discussed.  Regular use of seat belt and home safety were also discussed . Changes in health habits are decided on by patient with goals and time frames set for achieving them. Immunization and cancer screening  needs are specifically addressed at this visit.    1. Need for influenza vaccination  - Flu vaccine trivalent PF, 6mos and older(Flulaval,Afluria,Fluarix,Fluzone)  2. Need for Tdap vaccination  - Tdap vaccine greater than or equal to 7yo IM  3. Screening for endocrine, nutritional, metabolic and immunity disorder  - CBC - CMP14+EGFR - Lipid panel

## 2023-10-10 LAB — HEPB+HEPC+HIV PANEL
HIV Screen 4th Generation wRfx: NONREACTIVE
Hep B C IgM: NEGATIVE
Hep B Core Total Ab: NEGATIVE
Hep B E Ab: NONREACTIVE
Hep B E Ag: NEGATIVE
Hep B Surface Ab, Qual: NONREACTIVE
Hep C Virus Ab: NONREACTIVE
Hepatitis B Surface Ag: NEGATIVE

## 2023-10-10 LAB — CMP14+EGFR
ALT: 12 [IU]/L (ref 0–44)
AST: 18 [IU]/L (ref 0–40)
Albumin: 4.4 g/dL (ref 4.3–5.2)
Alkaline Phosphatase: 50 [IU]/L (ref 44–121)
BUN/Creatinine Ratio: 14 (ref 9–20)
BUN: 15 mg/dL (ref 6–20)
Bilirubin Total: 0.4 mg/dL (ref 0.0–1.2)
CO2: 26 mmol/L (ref 20–29)
Calcium: 10.2 mg/dL (ref 8.7–10.2)
Chloride: 100 mmol/L (ref 96–106)
Creatinine, Ser: 1.05 mg/dL (ref 0.76–1.27)
Globulin, Total: 2.8 g/dL (ref 1.5–4.5)
Glucose: 73 mg/dL (ref 70–99)
Potassium: 4.5 mmol/L (ref 3.5–5.2)
Sodium: 140 mmol/L (ref 134–144)
Total Protein: 7.2 g/dL (ref 6.0–8.5)
eGFR: 102 mL/min/{1.73_m2} (ref >59)

## 2023-10-10 LAB — CBC
Hematocrit: 47.2 % (ref 37.5–51.0)
Hemoglobin: 15.7 g/dL (ref 13.0–17.7)
MCH: 30.7 pg (ref 26.6–33.0)
MCHC: 33.3 g/dL (ref 31.5–35.7)
MCV: 92 fL (ref 79–97)
Platelets: 207 10*3/uL (ref 150–450)
RBC: 5.12 x10E6/uL (ref 4.14–5.80)
RDW: 12.1 % (ref 11.6–15.4)
WBC: 3.7 10*3/uL (ref 3.4–10.8)

## 2023-10-10 LAB — LIPID PANEL
Chol/HDL Ratio: 2.2 {ratio} (ref 0.0–5.0)
Cholesterol, Total: 140 mg/dL (ref 100–199)
HDL: 65 mg/dL (ref >39)
LDL Chol Calc (NIH): 64 mg/dL (ref 0–99)
Triglycerides: 49 mg/dL (ref 0–149)
VLDL Cholesterol Cal: 11 mg/dL (ref 5–40)

## 2023-10-12 LAB — CHLAMYDIA/GC NAA, CONFIRMATION
Chlamydia trachomatis, NAA: NEGATIVE
Neisseria gonorrhoeae, NAA: NEGATIVE

## 2023-10-29 ENCOUNTER — Ambulatory Visit
Admission: EM | Admit: 2023-10-29 | Discharge: 2023-10-29 | Disposition: A | Discharge status: Home / Self Care | Payer: BC Managed Care – PPO | Attending: Family Medicine | Admitting: Family Medicine

## 2023-10-29 DIAGNOSIS — Z113 Encounter for screening for infections with a predominantly sexual mode of transmission: Secondary | ICD-10-CM | POA: Diagnosis not present

## 2023-10-29 NOTE — ED Triage Notes (Signed)
Pt presents for STD testing. Denies sxs.  °

## 2023-10-29 NOTE — ED Triage Notes (Signed)
 Called out in waiting area 3 x no repsonse.

## 2023-10-29 NOTE — ED Provider Notes (Signed)
 [facility]    CSN: [id number] Arrival date & time: 10/29/23  1202      History   Chief Complaint Chief Complaint  Patient presents with   SEXUALLY TRANSMITTED DISEASE    HPI Gavin Taylor is a 23 y.o. male presents for STD screening.  Patient currently denies any symptoms including penile discharge, testicular pain or swelling, fevers, dysuria.  No STD exposure.  No other concerns at this time.  HPI  Past Medical History:  Diagnosis Date   Asthma    Diverticulitis    Frequent headaches     Patient Active Problem List   Diagnosis Date Noted   Need for Tdap vaccination 10/09/2023   Need for influenza vaccination 10/09/2023   Annual physical exam 10/09/2023   Chronic abdominal pain 10/09/2023   Screening examination for STI 10/09/2023   Upper respiratory infection 12/01/2022   Healthcare maintenance 04/14/2022    Past Surgical History:  Procedure Laterality Date   WISDOM TOOTH EXTRACTION     2019       Home Medications    Prior to Admission medications   Medication Sig Start Date End Date Taking? Authorizing Provider  albuterol  (VENTOLIN  HFA) 108 (90 Base) MCG/ACT inhaler Inhale 2 puffs into the lungs every 6 (six) hours as needed for wheezing or shortness of breath. 10/08/20   Stroud, Natalie M, FNP  Ascorbic Acid (VITAMIN C PO) Take by mouth.    [provider]  Cyanocobalamin (VITAMIN B 12 PO) Take by mouth.    [provider]  diclofenac (VOLTAREN) 75 MG EC tablet Take 75 mg by mouth as needed. Patient not taking: Reported on 10/09/2023    [provider]  Multiple Vitamins-Minerals (ZINC PO) Take by mouth.    [provider]  VITAMIN A PO Take by mouth.    [provider]  VITAMIN D PO Take by mouth.    [provider]  VITAMIN E PO Take by mouth.    [provider]    Family History Family History  Problem Relation Age of Onset   Hypertension Father    Asthma Sister      Social History Social History   Tobacco Use   Smoking status: Never   Smokeless tobacco: Never  Vaping Use   Vaping status: Never Used  Substance Use Topics   Alcohol use: Yes    Comment: occ   Drug use: Not Currently    Types: Marijuana     Allergies   Cefdinir   Review of Systems Review of Systems  Genitourinary:        STD screening     Physical Exam Triage Vital Signs ED Triage Vitals  Encounter Vitals Group     BP 10/29/23 1258 122/77     Systolic BP Percentile --      Diastolic BP Percentile --      Pulse Rate 10/29/23 1258 65     Resp 10/29/23 1258 18     Temp 10/29/23 1258 98 F (36.7 C)     Temp Source 10/29/23 1258 Oral     SpO2 10/29/23 1258 98 %     Weight --      Height --      Head Circumference --      Peak Flow --      Pain Score 10/29/23 1257 0     Pain Loc --      Pain Education --      Exclude from [employer]  Chart --    No data found.  Updated Vital Signs BP 122/77 (BP Location: Left Arm)   Pulse 65   Temp 98 F (36.7 C) (Oral)   Resp 18   SpO2 98%   Visual Acuity Right Eye Distance:   Left Eye Distance:   Bilateral Distance:    Right Eye Near:   Left Eye Near:    Bilateral Near:     Physical Exam Vitals and nursing note reviewed.  Constitutional:      Appearance: Normal appearance.  HENT:     Head: Normocephalic and atraumatic.  Eyes:     Pupils: Pupils are equal, round, and reactive to light.  Cardiovascular:     Rate and Rhythm: Normal rate.  Pulmonary:     Effort: Pulmonary effort is normal.  Abdominal:     Tenderness: There is no right CVA tenderness or left CVA tenderness.  Skin:    General: Skin is warm and dry.  Neurological:     General: No focal deficit present.     Mental Status: He is alert and oriented to person, place, and time.  Psychiatric:        Mood and Affect: Mood normal.        Behavior: Behavior normal.      UC Treatments / Results  Labs (all labs ordered are listed, but only  abnormal results are displayed) Labs Reviewed  CYTOLOGY, (ORAL, ANAL, URETHRAL) ANCILLARY ONLY    EKG   Radiology No results found.  Procedures Procedures (including critical care time)  Medications Ordered in UC Medications - No data to display  Initial Impression / Assessment and Plan / UC Course  I have reviewed the triage vital signs and the nursing notes.  Pertinent labs & imaging results that were available during my care of the patient were reviewed by me and considered in my medical decision making (see chart for details).     STD testing is ordered and will contact for any positive results.  Patient declined blood work.  Follow-up as needed. Final Clinical Impressions(s) / UC Diagnoses   Final diagnoses:  Screening examination for STD (sexually transmitted disease)     Discharge Instructions      The clinic will contact you with the results of the testing done today if positive.  Follow-up as needed.    ED Prescriptions   None    PDMP not reviewed this encounter.   Loreda Myla SAUNDERS, NP 10/29/23 1344

## 2023-10-29 NOTE — Discharge Instructions (Addendum)
 The clinic will contact you with the results of the testing done today if positive.  Follow-up as needed.

## 2023-10-30 LAB — CYTOLOGY, (ORAL, ANAL, URETHRAL) ANCILLARY ONLY
Chlamydia: NEGATIVE
Comment: NEGATIVE
Comment: NEGATIVE
Comment: NORMAL
Neisseria Gonorrhea: NEGATIVE
Trichomonas: NEGATIVE

## 2024-08-27 ENCOUNTER — Ambulatory Visit (INDEPENDENT_AMBULATORY_CARE_PROVIDER_SITE_OTHER): Payer: Self-pay | Admitting: Nurse Practitioner

## 2024-08-27 ENCOUNTER — Encounter: Payer: Self-pay | Admitting: Nurse Practitioner

## 2024-08-27 VITALS — BP 118/79 | HR 80 | Wt 159.0 lb

## 2024-08-27 DIAGNOSIS — Z113 Encounter for screening for infections with a predominantly sexual mode of transmission: Secondary | ICD-10-CM

## 2024-08-27 NOTE — Progress Notes (Addendum)
 s  Acute Office Visit  Subjective:     Patient ID: Gavin Taylor, male    DOB: 10-29-2000, 23 y.o.   MRN: [id number]  Chief Complaint  Patient presents with   std screening    Discussed the use of AI scribe software for clinical note transcription with the patient, who gave verbal consent to proceed.  History of Present Illness Gavin Taylor  is a 23 year old male   has a past medical history of Asthma, Diverticulitis, and Frequent headaches.  who presents for routine STD testing.  He is asymptomatic with no rashes, fever, unintentional weight loss, or discharge. He has only one sexual partner.    Assessment & Plan     Review of Systems  Constitutional:  Negative for appetite change, chills, fatigue and fever.  HENT:  Negative for congestion, postnasal drip, rhinorrhea and sneezing.   Respiratory:  Negative for cough, shortness of breath and wheezing.   Cardiovascular:  Negative for chest pain, palpitations and leg swelling.  Gastrointestinal:  Negative for abdominal pain, constipation, nausea and vomiting.  Genitourinary:  Negative for difficulty urinating, dysuria, flank pain and frequency.  Musculoskeletal:  Negative for arthralgias, back pain, joint swelling and myalgias.  Skin:  Negative for color change, pallor, rash and wound.  Neurological:  Negative for dizziness, facial asymmetry, weakness, numbness and headaches.  Psychiatric/Behavioral:  Negative for behavioral problems, confusion, self-injury and suicidal ideas.         Objective:    BP 118/79   Pulse 80   Wt 159 lb (72.1 kg)   SpO2 99%   BMI 22.81 kg/m    Physical Exam Vitals and nursing note reviewed.  Constitutional:      General: He is not in acute distress.    Appearance: Normal appearance. He is not ill-appearing, toxic-appearing or diaphoretic.  Eyes:     General: No scleral icterus.       Right eye: No discharge.        Left eye: No discharge.     Extraocular Movements: Extraocular movements  intact.     Conjunctiva/sclera: Conjunctivae normal.  Cardiovascular:     Rate and Rhythm: Normal rate and regular rhythm.     Pulses: Normal pulses.     Heart sounds: Normal heart sounds. No murmur heard.    No friction rub. No gallop.  Pulmonary:     Effort: Pulmonary effort is normal. No respiratory distress.     Breath sounds: Normal breath sounds. No stridor. No wheezing, rhonchi or rales.  Chest:     Chest wall: No tenderness.  Abdominal:     General: There is no distension.     Palpations: Abdomen is soft.     Tenderness: There is no abdominal tenderness. There is no right CVA tenderness, left CVA tenderness or guarding.  Musculoskeletal:        General: No swelling, tenderness, deformity or signs of injury.     Right lower leg: No edema.     Left lower leg: No edema.  Skin:    General: Skin is warm and dry.     Capillary Refill: Capillary refill takes less than 2 seconds.     Coloration: Skin is not jaundiced or pale.     Findings: No bruising, erythema or lesion.  Neurological:     Mental Status: He is alert and oriented to person, place, and time.     Motor: No weakness.     Coordination: Coordination normal.  Gait: Gait normal.  Psychiatric:        Mood and Affect: Mood normal.        Behavior: Behavior normal.        Thought Content: Thought content normal.        Judgment: Judgment normal.     No results found for any visits on 08/27/24.      Assessment & Plan:   Problem List Items Addressed This Visit       Other   Screening examination for STI - Primary   Routine checkup with no symptoms. Discussed STD prevention and treatment options. - Ordered STD testing labs. - Encouraged maintaining one sexual partner to reduce STD risk.       Relevant Orders   HepB+HepC+HIV Panel   RPR   Chlamydia/Gonococcus/Trichomonas, NAA    No orders of the defined types were placed in this encounter.   No follow-ups on file.  [place] R Taisei Bonnette, FNP

## 2024-08-27 NOTE — Assessment & Plan Note (Signed)
 Routine checkup with no symptoms. Discussed STD prevention and treatment options. - Ordered STD testing labs. - Encouraged maintaining one sexual partner to reduce STD risk.

## 2024-08-27 NOTE — Patient Instructions (Signed)

## 2024-08-28 LAB — HEPB+HEPC+HIV PANEL
HIV Screen 4th Generation wRfx: NONREACTIVE
Hep B C IgM: NEGATIVE
Hep B Core Total Ab: NEGATIVE
Hep B E Ab: NONREACTIVE
Hep B E Ag: NEGATIVE
Hep B Surface Ab, Qual: NONREACTIVE
Hep C Virus Ab: NONREACTIVE
Hepatitis B Surface Ag: NEGATIVE

## 2024-08-28 LAB — RPR: RPR Ser Ql: NONREACTIVE

## 2024-08-29 ENCOUNTER — Ambulatory Visit: Payer: Self-pay | Admitting: Nurse Practitioner

## 2024-08-29 LAB — CHLAMYDIA/GONOCOCCUS/TRICHOMONAS, NAA
Chlamydia by NAA: NEGATIVE
Gonococcus by NAA: NEGATIVE
Trich vag by NAA: NEGATIVE

## 2024-10-08 ENCOUNTER — Ambulatory Visit: Payer: Self-pay | Admitting: Nurse Practitioner

## 2024-10-14 ENCOUNTER — Encounter: Payer: Self-pay | Admitting: Nurse Practitioner

## 2024-10-14 ENCOUNTER — Ambulatory Visit: Payer: Self-pay | Admitting: Nurse Practitioner

## 2024-10-14 VITALS — BP 106/58 | HR 60 | Temp 97.1°F | Wt 160.4 lb

## 2024-10-14 DIAGNOSIS — Z0000 Encounter for general adult medical examination without abnormal findings: Secondary | ICD-10-CM | POA: Diagnosis not present

## 2024-10-14 DIAGNOSIS — J4520 Mild intermittent asthma, uncomplicated: Secondary | ICD-10-CM | POA: Diagnosis not present

## 2024-10-14 NOTE — Assessment & Plan Note (Signed)
" °  Discussed regular exercise, heart-healthy diet, adequate sleep, hydration, vaccinations, eye exams, and seatbelt use. - declined flu vaccine today. Advised to get HPV vaccine if not uptodate  - Encouraged regular exercise, 30 minutes, five days a week. - Recommended heart-healthy, low salt, low fat diet. - Advised 7-8 hours of sleep nightly and hydration with at least 64 ounces of water daily. - Encouraged annual eye exams. - Advised seatbelt use in car and home safety Declined labs  "

## 2024-10-14 NOTE — Patient Instructions (Signed)

## 2024-10-14 NOTE — Progress Notes (Signed)
 "  Complete physical exam  Patient: Gavin Taylor   DOB: 2001/01/16   23 y.o. Male  MRN: [id number]  Subjective:    Chief Complaint  Patient presents with   Annual Exam    No concerns today.        Discussed the use of AI scribe software for clinical note transcription with the patient, who gave verbal consent to proceed.  History of Present Illness Gavin Taylor is a 23 year old male who presents for an annual physical exam.  He has a history of asthma, which is generally well-controlled. He uses his inhaler occasionally and has noted improvement in symptoms with regular exercise, including running and working out at least four times a week.  He denies any new medication allergies and is not currently taking any medications apart from sporadic vitamin use.  He lives with his sister and occasionally consumes alcohol. He does not use drugs, smoke, or vape.  No fever, chills, chest pain, shortness of breath, abdominal pain, nausea, or vomiting. He experiences occasional abdominal pain but not at the time of the visit.  He wears glasses and stated that he needs to visit an eye doctor this year.   Assessment & Plan     Most recent fall risk assessment:    04/14/2022   10:03 AM  Fall Risk   Falls in the past year? 0  Number falls in past yr: 0  Injury with Fall? 0   Risk for fall due to : No Fall Risks  Follow up Falls evaluation completed      Data saved with a previous flowsheet row definition     Most recent depression screenings:    08/27/2024   11:34 AM 03/02/2023   10:25 AM  PHQ 2/9 Scores  PHQ - 2 Score 0 0  PHQ- 9 Score 0       Data saved with a previous flowsheet row definition        Patient Care Team: Jamison Yuhasz R, FNP as PCP - General (Nurse Practitioner)   Show/hide medication list[1]  Review of Systems  Constitutional:  Negative for appetite change, chills, fatigue and fever.  HENT:  Negative for congestion, postnasal drip, rhinorrhea and  sneezing.   Eyes:  Negative for pain, discharge and itching.  Respiratory:  Negative for cough, shortness of breath and wheezing.   Cardiovascular:  Negative for chest pain, palpitations and leg swelling.  Gastrointestinal:  Negative for abdominal pain, constipation, nausea and vomiting.  Endocrine: Negative for cold intolerance, heat intolerance and polydipsia.  Genitourinary:  Negative for difficulty urinating, dysuria, flank pain and frequency.  Musculoskeletal:  Negative for arthralgias, back pain, joint swelling and myalgias.  Skin:  Negative for color change, pallor, rash and wound.  Neurological:  Negative for dizziness, facial asymmetry, weakness, numbness and headaches.  Psychiatric/Behavioral:  Negative for behavioral problems, confusion, self-injury and suicidal ideas.        Objective:     BP (!) 106/58 (BP Location: Right Arm, Patient Position: Sitting, Cuff Size: Normal)   Pulse 60   Temp (!) 97.1 F (36.2 C) (Temporal)   Wt 160 lb 6.4 oz (72.8 kg)   SpO2 99%   BMI 23.02 kg/m    Physical Exam Vitals and nursing note reviewed.  Constitutional:      General: He is not in acute distress.    Appearance: Normal appearance. He is not ill-appearing, toxic-appearing or diaphoretic.  HENT:     Right Ear: Tympanic membrane, ear  canal and external ear normal. There is no impacted cerumen.     Left Ear: Tympanic membrane, ear canal and external ear normal. There is no impacted cerumen.     Nose: Nose normal. No congestion or rhinorrhea.     Mouth/Throat:     Mouth: Mucous membranes are moist.     Pharynx: Oropharynx is clear. No oropharyngeal exudate or posterior oropharyngeal erythema.  Eyes:     General: No scleral icterus.       Right eye: No discharge.        Left eye: No discharge.     Extraocular Movements: Extraocular movements intact.     Conjunctiva/sclera: Conjunctivae normal.  Neck:     Vascular: No carotid bruit.  Cardiovascular:     Rate and Rhythm:  Normal rate and regular rhythm.     Pulses: Normal pulses.     Heart sounds: Normal heart sounds. No murmur heard.    No friction rub. No gallop.  Pulmonary:     Effort: Pulmonary effort is normal. No respiratory distress.     Breath sounds: Normal breath sounds. No stridor. No wheezing, rhonchi or rales.  Chest:     Chest wall: No tenderness.  Abdominal:     General: Bowel sounds are normal. There is no distension.     Palpations: Abdomen is soft. There is no mass.     Tenderness: There is no abdominal tenderness. There is no right CVA tenderness, left CVA tenderness, guarding or rebound.     Hernia: No hernia is present.  Musculoskeletal:        General: No swelling, tenderness, deformity or signs of injury.     Cervical back: Normal range of motion and neck supple. No rigidity or tenderness.     Right lower leg: No edema.     Left lower leg: No edema.  Lymphadenopathy:     Cervical: No cervical adenopathy.  Skin:    General: Skin is warm and dry.     Capillary Refill: Capillary refill takes less than 2 seconds.     Coloration: Skin is not jaundiced or pale.     Findings: No bruising, erythema, lesion or rash.  Neurological:     Mental Status: He is alert and oriented to person, place, and time.     Cranial Nerves: No cranial nerve deficit.     Motor: No weakness.     Gait: Gait normal.  Psychiatric:        Mood and Affect: Mood normal.        Behavior: Behavior normal.        Thought Content: Thought content normal.        Judgment: Judgment normal.     No results found for any visits on 10/14/24.     Assessment & Plan:    Routine Health Maintenance and Physical Exam  Immunization History  Administered Date(s) Administered   DTP 03/29/2009, 05/28/2009   DTaP 09/02/2009, 06/08/2010   HIB, Unspecified 03/29/2009, 05/28/2009, 07/02/2009, 06/08/2010   HPV 9-valent 01/01/2019   Hepatitis B, PED/ADOLESCENT 02/25/2009, 03/29/2009, 08/29/2010   IPV 03/29/2009,  05/28/2009, 08/29/2010   Influenza, Seasonal, Injecte, Preservative Fre 10/09/2023   Influenza,inj,quad, With Preservative 09/02/2009, 10/05/2009, 08/29/2010   Influenza-Unspecified 12/07/2014   MMR 03/31/2010   Moderna Sars-Covid-2 Vaccination 01/08/2020, 02/10/2020   Pneumococcal Conjugate,unspecified 05/28/2009, 07/02/2009, 09/02/2009, 03/31/2010   Rotavirus Monovalent 07/02/2009, 09/02/2009   Tdap 07/15/2012, 10/09/2023   Unspecified SARS-COV-2 Vaccination 04/06/2021   Varicella 03/31/2010    Health  Maintenance  Topic Date Due   HPV VACCINES (2 - Male 3-dose series) 01/29/2019   Pneumococcal Vaccine (1 of 2 - PCV) 06/25/2019   COVID-19 Vaccine (4 - 2025-26 season) 06/23/2024   Influenza Vaccine  01/20/2025 (Originally 05/23/2024)   DTaP/Tdap/Td (6 - Td or Tdap) 10/08/2033   Hepatitis B Vaccines 19-59 Average Risk  Completed   Hepatitis C Screening  Completed   HIV Screening  Completed   Meningococcal B Vaccine  Aged Out    Discussed health benefits of physical activity, and encouraged him to engage in regular exercise appropriate for his age and condition.  Problem List Items Addressed This Visit       Respiratory   Asthma    Well-controlled with infrequent albuterol   inhaler use, primarily during exercise. - Continue current asthma management plan with Albuterol  inhaler as needed.        Other   Annual physical exam - Primary    Discussed regular exercise, heart-healthy diet, adequate sleep, hydration, vaccinations, eye exams, and seatbelt use. - declined flu vaccine today. Advised to get HPV vaccine if not uptodate  - Encouraged regular exercise, 30 minutes, five days a week. - Recommended heart-healthy, low salt, low fat diet. - Advised 7-8 hours of sleep nightly and hydration with at least 64 ounces of water daily. - Encouraged annual eye exams. - Advised seatbelt use in car and home safety Declined labs       Return in about 1 year (around 10/14/2025) for  CPE.     Decklyn Hyder R Ireoluwa Grant, FNP     [1]  Outpatient Medications Prior to Visit  Medication Sig   Cyanocobalamin (VITAMIN B 12 PO) Take by mouth.   Multiple Vitamins-Minerals (ZINC PO) Take by mouth.   VITAMIN A PO Take by mouth.   VITAMIN D PO Take by mouth.   VITAMIN E PO Take by mouth.   albuterol  (VENTOLIN  HFA) 108 (90 Base) MCG/ACT inhaler Inhale 2 puffs into the lungs every 6 (six) hours as needed for wheezing or shortness of breath. (Patient not taking: Reported on 10/14/2024)   Ascorbic Acid (VITAMIN C PO) Take by mouth. (Patient not taking: Reported on 10/14/2024)   diclofenac (VOLTAREN) 75 MG EC tablet Take 75 mg by mouth as needed. (Patient not taking: Reported on 10/14/2024)   No facility-administered medications prior to visit.   "

## 2024-10-14 NOTE — Assessment & Plan Note (Signed)
" °  Well-controlled with infrequent albuterol   inhaler use, primarily during exercise. - Continue current asthma management plan with Albuterol  inhaler as needed. "

## 2025-10-19 ENCOUNTER — Encounter: Payer: Self-pay | Admitting: Nurse Practitioner
